# Patient Record
Sex: Female | Born: 1985 | Race: White | Hispanic: No | Marital: Single | Smoking: Current every day smoker
Health system: Southern US, Community
[De-identification: ages and names within clinical notes are randomized; demographics above are authoritative.]

## PROBLEM LIST (undated history)

## (undated) DIAGNOSIS — F419 Anxiety disorder, unspecified: Secondary | ICD-10-CM

## (undated) DIAGNOSIS — F32A Depression, unspecified: Secondary | ICD-10-CM

## (undated) HISTORY — DX: Anxiety disorder, unspecified: F41.9

## (undated) HISTORY — PX: COLPOSCOPY: SHX161

## (undated) HISTORY — DX: Depression, unspecified: F32.A

---

## 2020-05-07 ENCOUNTER — Emergency Department (HOSPITAL_BASED_OUTPATIENT_CLINIC_OR_DEPARTMENT_OTHER): Payer: Medicaid - Out of State

## 2020-05-07 ENCOUNTER — Emergency Department (HOSPITAL_BASED_OUTPATIENT_CLINIC_OR_DEPARTMENT_OTHER)
Admission: EM | Admit: 2020-05-07 | Discharge: 2020-05-07 | Disposition: A | Discharge status: Home / Self Care | Payer: Medicaid - Out of State | Attending: Emergency Medicine | Admitting: Emergency Medicine

## 2020-05-07 ENCOUNTER — Encounter (HOSPITAL_BASED_OUTPATIENT_CLINIC_OR_DEPARTMENT_OTHER): Payer: Self-pay

## 2020-05-07 ENCOUNTER — Other Ambulatory Visit: Payer: Self-pay

## 2020-05-07 DIAGNOSIS — O200 Threatened abortion: Secondary | ICD-10-CM | POA: Insufficient documentation

## 2020-05-07 DIAGNOSIS — O209 Hemorrhage in early pregnancy, unspecified: Secondary | ICD-10-CM | POA: Diagnosis present

## 2020-05-07 DIAGNOSIS — N939 Abnormal uterine and vaginal bleeding, unspecified: Secondary | ICD-10-CM

## 2020-05-07 DIAGNOSIS — O3680X0 Pregnancy with inconclusive fetal viability, not applicable or unspecified: Secondary | ICD-10-CM

## 2020-05-07 DIAGNOSIS — Z3A01 Less than 8 weeks gestation of pregnancy: Secondary | ICD-10-CM | POA: Insufficient documentation

## 2020-05-07 DIAGNOSIS — F17210 Nicotine dependence, cigarettes, uncomplicated: Secondary | ICD-10-CM | POA: Insufficient documentation

## 2020-05-07 LAB — CBC
HCT: 41 % (ref 36.0–46.0)
Hemoglobin: 14 g/dL (ref 12.0–15.0)
MCH: 33.7 pg (ref 26.0–34.0)
MCHC: 34.1 g/dL (ref 30.0–36.0)
MCV: 98.6 fL (ref 80.0–100.0)
Platelets: 271 10*3/uL (ref 150–400)
RBC: 4.16 MIL/uL (ref 3.87–5.11)
RDW: 12.6 % (ref 11.5–15.5)
WBC: 8.9 10*3/uL (ref 4.0–10.5)
nRBC: 0 % (ref 0.0–0.2)

## 2020-05-07 LAB — HCG, QUANTITATIVE, PREGNANCY: hCG, Beta Chain, Quant, S: 1044 m[IU]/mL — ABNORMAL HIGH (ref <5)

## 2020-05-07 LAB — HIV ANTIBODY (ROUTINE TESTING W REFLEX): HIV Screen 4th Generation wRfx: NONREACTIVE

## 2020-05-07 LAB — BASIC METABOLIC PANEL
Anion gap: 10 (ref 5–15)
BUN: 10 mg/dL (ref 6–20)
CO2: 23 mmol/L (ref 22–32)
Calcium: 9.5 mg/dL (ref 8.9–10.3)
Chloride: 102 mmol/L (ref 98–111)
Creatinine, Ser: 0.62 mg/dL (ref 0.44–1.00)
GFR, Estimated: >60 mL/min (ref >60)
Glucose, Bld: 90 mg/dL (ref 70–99)
Potassium: 4.4 mmol/L (ref 3.5–5.1)
Sodium: 135 mmol/L (ref 135–145)

## 2020-05-07 LAB — WET PREP, GENITAL
Sperm: NONE SEEN
Trich, Wet Prep: NONE SEEN
Yeast Wet Prep HPF POC: NONE SEEN

## 2020-05-07 LAB — PREGNANCY, URINE: Preg Test, Ur: POSITIVE — AB

## 2020-05-07 NOTE — ED Triage Notes (Signed)
Pt arrives stating she is appx [redacted] weeks pregnant. The past 3 days has had brown vaginal discharge, lower back pain, lower abdominal cramping. G5P2.

## 2020-05-07 NOTE — Discharge Instructions (Addendum)
You will need to follow up in 48 hours for a repeat Beta HCG test with the [facility]. Your appointment is at 9:25 AM.  Get help right away if: Heavy bleeding soaks through 2 large sanitary pads an hour for more than 2 hours. Blood clots come out of your vagina. Tissue comes out of your vagina. You leak fluid, or you have a gush of fluid from your vagina. You have severe low back pain or cramps in your abdomen. You have a fever, chills, and severe pain in the abdomen.

## 2020-05-07 NOTE — ED Provider Notes (Signed)
[facility] EMERGENCY DEPARTMENT Provider Note   CSN: [id number] Arrival date & time: 05/07/20  1142     History Chief Complaint  Patient presents with  . Vaginal Bleeding    Pregnant    Miranda Boyd is a 35 y.o. female who presents emergency department with chief complaint of vaginal bleeding.  Patient has had a positive pregnancy test at home with last menstrual period on 04/06/2020.  She complains of some mild low back pain, some vaginal cramping and some brown bloody discharge on and off for the past 3 days.  She is G5 D6222.  She denies any severe pain, fevers, chills, urinary symptoms.  HPI     History reviewed. No pertinent past medical history.  There are no problems to display for this patient.   History reviewed. No pertinent surgical history.   OB History    Gravida  1   Para      Term      Preterm      AB      Living        SAB      IAB      Ectopic      Multiple      Live Births              History reviewed. No pertinent family history.  Social History   Tobacco Use  . Smoking status: Current Every Day Smoker    Packs/day: 0.50    Types: Cigarettes  Substance Use Topics  . Alcohol use: Not Currently  . Drug use: Never    Home Medications Prior to Admission medications   Not on File    Allergies    Keflex [cephalexin]  Review of Systems   Review of Systems Ten systems reviewed and are negative for acute change, except as noted in the HPI.  Physical Exam Updated Vital Signs BP 112/66   Pulse 94   Temp 98.9 F (37.2 C) (Oral)   Resp 18   Ht 5\' 4"  (1.626 m)   Wt 59 kg   LMP 04/06/2020   SpO2 99%   BMI 22.31 kg/m   Physical Exam Vitals and nursing note reviewed. Exam conducted with a chaperone present.  Constitutional:      General: She is not in acute distress.    Appearance: She is well-developed and well-nourished. She is not diaphoretic.  HENT:     Head: Normocephalic and atraumatic.  Eyes:      General: No scleral icterus.    Conjunctiva/sclera: Conjunctivae normal.  Cardiovascular:     Rate and Rhythm: Normal rate and regular rhythm.     Heart sounds: Normal heart sounds. No murmur heard. No friction rub. No gallop.   Pulmonary:     Effort: Pulmonary effort is normal. No respiratory distress.     Breath sounds: Normal breath sounds.  Abdominal:     General: Bowel sounds are normal. There is no distension.     Palpations: Abdomen is soft. There is no mass.     Tenderness: There is no abdominal tenderness. There is no guarding.  Genitourinary:    Exam position: Supine.     Comments: Normal external female genitalia, vaginal walls pink and healthy without discharge.  There is blood in the vaginal vault stemming from oozing blood from the cervical os.  Positive Chadwick sign.  No adnexal tenderness or fullness, no CMT. Musculoskeletal:     Cervical back: Normal range of motion.  Skin:  General: Skin is warm and dry.  Neurological:     Mental Status: She is alert and oriented to person, place, and time.  Psychiatric:        Behavior: Behavior normal.     ED Results / Procedures / Treatments   Labs (all labs ordered are listed, but only abnormal results are displayed) Labs Reviewed  WET PREP, GENITAL - Abnormal; Notable for the following components:      Result Value   Clue Cells Wet Prep HPF POC PRESENT (*)    WBC, Wet Prep HPF POC MANY (*)    All other components within normal limits  PREGNANCY, URINE - Abnormal; Notable for the following components:   Preg Test, Ur POSITIVE (*)    All other components within normal limits  HCG, QUANTITATIVE, PREGNANCY - Abnormal; Notable for the following components:   hCG, Beta Chain, Quant, S 1,044 (*)    All other components within normal limits  BASIC METABOLIC PANEL  CBC  RPR  URINALYSIS, ROUTINE W REFLEX MICROSCOPIC  HIV ANTIBODY (ROUTINE TESTING W REFLEX)  GC/CHLAMYDIA PROBE AMP ([place]) NOT AT [facility]     EKG None  Radiology US OB LESS THAN 14 WEEKS WITH OB TRANSVAGINAL  Result Date: 05/07/2020 CLINICAL DATA:  Pregnant patient in first-trimester pregnancy with vaginal bleeding. Beta HCG 1,044. gestational age by LMP 4 weeks 3 days. EXAM: OBSTETRIC <14 WK [place] AND TRANSVAGINAL OB US TECHNIQUE: Both transabdominal and transvaginal ultrasound examinations were performed for complete evaluation of the gestation as well as the maternal uterus, adnexal regions, and pelvic cul-de-sac. Transvaginal technique was performed to assess early pregnancy. COMPARISON:  None. FINDINGS: Intrauterine gestational sac: None Yolk sac:  Not Visualized. Embryo:  Not Visualized. Maternal uterus/adnexae: The uterus is anteverted. The fundal endometrium is thickened at 15 mm and heterogeneous. There are few tiny cystic areas in the thickened endometrium. Endometrium in the uterine body and lower uterine segment is thin. There is no intrauterine gestational sac. Both ovaries are visualized and are normal. The right ovary measures 3.1 x 1.9 x 2.0 cm. The left ovary measures 2.7 x 1.9 x 1.9 cm. Blood flow seen to both ovaries. No pelvic free fluid. No evidence of adnexal mass. IMPRESSION: 1. No intrauterine pregnancy or findings suspicious for ectopic pregnancy. Findings are consistent with pregnancy of unknown location and may reflect early intrauterine pregnancy not yet visualized sonographically, occult ectopic pregnancy, or failed pregnancy. Recommend trending of beta HCG. Recommend follow-up ultrasound in 7-10 days as indicated. 2. Fundal endometrium is thickened, heterogeneous with a few small cystic areas, nonspecific. Recommend attention at follow-up. Electronically Signed   By: Narda Rutherford M.D.   On: 05/07/2020 15:42    Procedures Procedures   Medications Ordered in ED Medications - No data to display  ED Course  I have reviewed the triage vital signs and the nursing notes.  Pertinent labs & imaging results that  were available during my care of the patient were reviewed by me and considered in my medical decision making (see chart for details).  Clinical Course as of 05/07/20 1608  Wed May 07, 2020  1421 EDD 01/11/2021 EGA [redacted]w[redacted]d [AH]    Clinical Course User Index [AH] Arthor Captain, PA-C   MDM Rules/Calculators/A&P                          35 year old female here with vaginal bleeding in first trimester. The differential diagnosis for vaginal bleeding in pregnancy  less than 20 weeks includes but is not limited to the following: Ectopic pregnancy, Subchorionic hematoma, First Trimester Abortion, Gestational trophoblastic disease, Heterotopic pregnancy, Implantation bleeding, Molar pregnancy, Cervicitis, Fibroids, Vaginal Trauma I ordered and reviewed labs which include CBC and BMP without abnormality, quantitative hCG of 1044 which would be low but within range of estimated 4-week 2-day pregnancy. Blood prep shows mild clue cells and white cells however patient is otherwise asymptomatic.  I ordered and reviewed ultrasound of the pelvis which shows no visualized pregnancy.  Patient will need 2-day follow-up with GYN for repeat hCG level.  I have secured a follow-up appointment at 9:25 AM here at the [facility].  Discussed return precautions.  Patient is otherwise appropriate for discharge at this time Final Clinical Impression(s) / ED Diagnoses Final diagnoses:  Pregnancy of unknown anatomic location  Threatened miscarriage    Rx / DC Orders ED Discharge Orders    None       Arthor Captain, PA-C 05/07/20 1612    Melene Plan, DO 05/08/20 0800

## 2020-05-08 LAB — GC/CHLAMYDIA PROBE AMP (~~LOC~~) NOT AT ARMC
Chlamydia: NEGATIVE
Comment: NEGATIVE
Comment: NORMAL
Neisseria Gonorrhea: NEGATIVE

## 2020-05-08 LAB — RPR: RPR Ser Ql: NONREACTIVE

## 2020-05-09 ENCOUNTER — Other Ambulatory Visit: Payer: 59

## 2020-05-09 ENCOUNTER — Other Ambulatory Visit: Payer: Self-pay

## 2020-05-09 DIAGNOSIS — O3680X0 Pregnancy with inconclusive fetal viability, not applicable or unspecified: Secondary | ICD-10-CM

## 2020-05-09 NOTE — Progress Notes (Signed)
Patient was assessed and managed by nursing staff during this encounter. I have reviewed the chart and agree with the documentation and plan. I have also made any necessary editorial changes.  Jaynie Collins, MD 05/09/2020 11:17 AM

## 2020-05-09 NOTE — Progress Notes (Signed)
Pt presents for repeat Beta hCG. Pt states she is having severe cramps and heavy bleeding. Pt was sent to the lab to have Beta hCG labs drawn.   Arthur Aydelotte l Cortasia Screws, CMA

## 2020-05-10 LAB — BETA HCG QUANT (REF LAB): hCG Quant: 1279 m[IU]/mL

## 2020-05-12 ENCOUNTER — Inpatient Hospital Stay (HOSPITAL_COMMUNITY)
Admission: AD | Admit: 2020-05-12 | Discharge: 2020-05-12 | Disposition: A | Discharge status: Home / Self Care | Payer: Medicaid - Out of State | Attending: Family Medicine | Admitting: Family Medicine

## 2020-05-12 ENCOUNTER — Encounter (HOSPITAL_COMMUNITY): Payer: Self-pay | Admitting: Family Medicine

## 2020-05-12 ENCOUNTER — Telehealth: Payer: Self-pay

## 2020-05-12 ENCOUNTER — Other Ambulatory Visit: Payer: Self-pay

## 2020-05-12 ENCOUNTER — Inpatient Hospital Stay (HOSPITAL_COMMUNITY): Payer: Medicaid - Out of State

## 2020-05-12 DIAGNOSIS — O00101 Right tubal pregnancy without intrauterine pregnancy: Secondary | ICD-10-CM | POA: Diagnosis present

## 2020-05-12 DIAGNOSIS — Z3A01 Less than 8 weeks gestation of pregnancy: Secondary | ICD-10-CM | POA: Insufficient documentation

## 2020-05-12 DIAGNOSIS — O99331 Smoking (tobacco) complicating pregnancy, first trimester: Secondary | ICD-10-CM | POA: Diagnosis not present

## 2020-05-12 DIAGNOSIS — O00201 Right ovarian pregnancy without intrauterine pregnancy: Secondary | ICD-10-CM

## 2020-05-12 DIAGNOSIS — F17210 Nicotine dependence, cigarettes, uncomplicated: Secondary | ICD-10-CM | POA: Insufficient documentation

## 2020-05-12 LAB — COMPREHENSIVE METABOLIC PANEL
ALT: 22 U/L (ref 0–44)
AST: 20 U/L (ref 15–41)
Albumin: 4.3 g/dL (ref 3.5–5.0)
Alkaline Phosphatase: 43 U/L (ref 38–126)
Anion gap: 10 (ref 5–15)
BUN: 11 mg/dL (ref 6–20)
CO2: 25 mmol/L (ref 22–32)
Calcium: 9.5 mg/dL (ref 8.9–10.3)
Chloride: 104 mmol/L (ref 98–111)
Creatinine, Ser: 0.68 mg/dL (ref 0.44–1.00)
GFR, Estimated: >60 mL/min (ref >60)
Glucose, Bld: 84 mg/dL (ref 70–99)
Potassium: 4.2 mmol/L (ref 3.5–5.1)
Sodium: 139 mmol/L (ref 135–145)
Total Bilirubin: 0.5 mg/dL (ref 0.3–1.2)
Total Protein: 7 g/dL (ref 6.5–8.1)

## 2020-05-12 LAB — CBC
HCT: 39.2 % (ref 36.0–46.0)
Hemoglobin: 13.1 g/dL (ref 12.0–15.0)
MCH: 32.7 pg (ref 26.0–34.0)
MCHC: 33.4 g/dL (ref 30.0–36.0)
MCV: 97.8 fL (ref 80.0–100.0)
Platelets: 277 10*3/uL (ref 150–400)
RBC: 4.01 MIL/uL (ref 3.87–5.11)
RDW: 12.5 % (ref 11.5–15.5)
WBC: 8.4 10*3/uL (ref 4.0–10.5)
nRBC: 0 % (ref 0.0–0.2)

## 2020-05-12 LAB — URINALYSIS, ROUTINE W REFLEX MICROSCOPIC
Bilirubin Urine: NEGATIVE
Glucose, UA: NEGATIVE mg/dL
Hgb urine dipstick: NEGATIVE
Ketones, ur: NEGATIVE mg/dL
Leukocytes,Ua: NEGATIVE
Nitrite: NEGATIVE
Protein, ur: NEGATIVE mg/dL
Specific Gravity, Urine: 1.021 (ref 1.005–1.030)
pH: 7 (ref 5.0–8.0)

## 2020-05-12 LAB — HCG, QUANTITATIVE, PREGNANCY: hCG, Beta Chain, Quant, S: 3812 m[IU]/mL — ABNORMAL HIGH (ref <5)

## 2020-05-12 LAB — ABO/RH: ABO/RH(D): A POS

## 2020-05-12 MED ORDER — METHOTREXATE SODIUM CHEMO INJECTION 50 MG/2ML
50.0000 mg/m2 | Freq: Once | INTRAMUSCULAR | Status: AC
  Administered 2020-05-12: 85 mg via INTRAMUSCULAR
  Filled 2020-05-12: qty 3.4

## 2020-05-12 MED ORDER — METHOTREXATE FOR ECTOPIC PREGNANCY: 50.0000 mg/m2 | Freq: Once | INTRAMUSCULAR | Status: DC

## 2020-05-12 MED ORDER — TRAMADOL HCL 50 MG PO TABS: 50.0000 mg | ORAL_TABLET | Freq: Four times a day (QID) | ORAL | 0 refills | Status: DC | PRN

## 2020-05-12 NOTE — MAU Provider Note (Signed)
  History     CSN: [id number]  Arrival date and time: 05/12/20 1407   None     Chief Complaint  Patient presents with  . Back Pain  . Vaginal Bleeding   HPI Patient Miranda Boyd is a 35 y.o.  G1P0  at [redacted]w[redacted]d here for follow up bHCG. Patient has been followed for pregnancy of unknown location since 2-4; she denies pain with urination but reports a right constant pain and spotting. These symptoms started yesterday.   OB History    Gravida  1   Para      Term      Preterm      AB      Living        SAB      IAB      Ectopic      Multiple      Live Births              No past medical history on file.  No past surgical history on file.  No family history on file.  Social History   Tobacco Use  . Smoking status: Current Every Day Smoker    Packs/day: 0.50    Types: Cigarettes  Substance Use Topics  . Alcohol use: Not Currently  . Drug use: Never    Allergies:  Allergies  Allergen Reactions  . Keflex [Cephalexin] Hives    No medications prior to admission.    Review of Systems  Constitutional: Negative.   HENT: Negative.   Respiratory: Negative.   Cardiovascular: Negative.   Gastrointestinal: Positive for abdominal pain.  Genitourinary: Positive for vaginal bleeding.  Psychiatric/Behavioral: Negative.    Physical Exam   Blood pressure 113/63, pulse 79, temperature 98.9 F (37.2 C), temperature source Oral, resp. rate 16, height 5\' 4"  (1.626 m), weight 62.8 kg, last menstrual period 04/06/2020, SpO2 99 %.  Physical Exam Constitutional:      Appearance: Normal appearance.  Neurological:     General: No focal deficit present.     Mental Status: She is alert.  Psychiatric:        Mood and Affect: Mood normal.    Pelvic exam deferred MAU Course  Procedures  MDM Patient's bHCG increases from 1044 to 1270 to 3812; however, patient has new onset of pain and spotting; discussed with Dr. 06/04/2020 and will re-image.   -LFTS reviewed; all  normal  I have independently reviewed the [facility] images, which reveal finding of right tubal ovarian mass.   Assessment and Plan   1. Right tubal pregnancy without intrauterine pregnancy    -LFTs are normal; patient is appropriate candidate for Methotrexate; patient given precautions and guidance on what to expect. Medication administered without incident in MAU.  -Appt made for Thursday for follow-up stat [facility] and patient given instructions to wait for phone call, and she knows she may need another dose of MTX or will follow-up in MAU for Day # 7 labs.  -Patient given strict ectopic precautions, explained importance of trending bhcg.    [facility] Fumiko Cham 05/12/2020, 8:28 PM

## 2020-05-12 NOTE — Telephone Encounter (Signed)
Patient had her HCG drawn on Friday and given the results. Patient made aware that the HCG level was 1279 (on 05-07-20 it was 1044) Patient called the after hours line this weekend and was having some abdominal cramping and she reports now she is having some light bleeding. Patient was instructed to go to hospital for evaluation but has not gone anywhere.   Patient instructed this would still be the best course of action since she is having the abdominal pain and the inappropriate rise in her HCG. Patient states she will probably go "sometime today".  Patient given address to MAU at [facility] RN

## 2020-05-12 NOTE — Discharge Instructions (Signed)
-keep appt on Thursday, 2-10 at 1:30 pm for day# 4 labs(see address on discharge papers); return to MAU on Sunday morning for Day #7 labs.    Methotrexate Treatment for an Ectopic Pregnancy Methotrexate is a medicine that treats an ectopic pregnancy. In this type of pregnancy, the fertilized egg attaches (implants) outside the uterus. An ectopic pregnancy cannot develop into a healthy baby. Methotrexate works by stopping the growth of the fertilized egg. It also helps the body absorb tissue from the egg. This takes about 2-6 weeks. An ectopic pregnancy can be life-threatening. However, most ectopic pregnancies can be successfully treated with methotrexate if they are diagnosed early. Tell a health care provider about:  Any allergies you have.  All medicines you are taking, including vitamins, herbs, eye drops, creams, and over-the-counter medicines.  Any medical conditions you have. What are the risks? Generally, this is a safe treatment. However, problems may occur, including:  Digestive problems. You may have: ? Nausea. ? Vomiting. ? Diarrhea. ? Cramping in your abdomen.  Bleeding or spotting from your vagina.  Feeling dizzy or light-headed.  Mouth sores.  Inflammation of the lining of your lungs (pneumonitis).  Damage to nearby structures or organs, such as damage to the liver.  Hair loss. There is a risk that methotrexate treatment will fail and the pregnancy will continue. There is also a risk that the ectopic pregnancy might tear or burst (rupture) during use of this medicine. What happens before the procedure?  Blood tests will be done to check how your disease-fighting system (immune system), liver, and kidneys are working.  You will also have blood tests to measure your pregnancy hormone levels and to find out your blood type.  You will be given a shot of a medicine called Rho(D) immune globulin if: ? You are Rh-negative and the father is Rh-positive. ? You are  Rh-negative and the father's Rh type is unknown. What happens during the procedure?  Methotrexate will be injected into your muscle. ? Methotrexate may be given as a single dose of medicine or a series of doses over time, depending on your response to the treatment. ? Methotrexate injections are given by a health care provider. Injection is the most common way that this medicine is used to treat an ectopic pregnancy.  You may also receive other medicines to manage your ectopic pregnancy. The procedure may vary among health care providers and hospitals. What can I expect after treatment? After your treatment, it is common to have:  Cramping in your abdomen.  Bleeding in your vagina.  Tiredness (fatigue).  Nausea.  Vomiting.  Diarrhea. Blood tests will be done at timed intervals for several days or weeks to check your pregnancy hormone levels. The blood tests will be done until the pregnancy hormone can no longer be found in the blood. If the methotrexate treatment does not work, a surgical procedure may be done to remove the ectopic pregnancy. Follow these instructions at home: Medicines  Take over-the-counter and prescription medicines only as told by your health care provider.  Do not take prescription pain medicines, aspirin, ibuprofen, naproxen, or any other NSAIDs.  Do not take folic acid, prenatal vitamins, or other vitamins that contain folic acid. Activity  Do not have sex, douche, or put anything, such as tampons, in your vagina until your health care provider says it is okay.  Limit activities that take a lot of effort as told by your health care provider. General instructions  Do not drink alcohol.  Follow instructions from your health care provider about eating restrictions, such as avoiding foods that produce a lot of gas. These foods can hide the signs of a ruptured ectopic pregnancy.  Limit exposure to sunlight or artificial UV light such as from tanning  beds. Methotrexate can make you more sensitive to the sun.  Follow instructions from your health care provider on how and when to report any symptoms that may indicate a ruptured ectopic pregnancy.  Keep all follow-up visits. This is important.   Contact a health care provider if:  You have persistent nausea and vomiting.  You have persistent diarrhea.  You are having a reaction to the medicine. This may include: ? Unusual fatigue. ? Skin rash. Get help right away if:  Pain in your abdomen or in the area between your hip bones (pelvic area) gets worse.  You have more bleeding from your vagina.  You feel light-headed or you faint.  You are short of breath.  Your heart rate increases.  You develop a cough.  You have chills or a fever. Summary  Methotrexate is a medicine that treats an ectopic pregnancy. This type of pregnancy forms outside the uterus.  There is a risk that methotrexate treatment will fail and the pregnancy will continue. There is also a risk that the ectopic pregnancy might tear or burst during use of this medicine.  This medicine may be given in a single dose or a series of doses over time.  After your treatment, blood tests will be done at timed intervals for several days or weeks to check your pregnancy hormone levels. The blood tests will be done until no more pregnancy hormone is found in the blood. This information is not intended to replace advice given to you by your health care provider. Make sure you discuss any questions you have with your health care provider. Document Revised: 09/05/2019 Document Reviewed: 09/05/2019 Elsevier Patient Education  2021 [employer].

## 2020-05-12 NOTE — MAU Note (Signed)
Miranda Boyd is a 35 y.o. at [redacted]w[redacted]d here in MAU reporting: was sent over by the office for evaluation due to abnormal rise in hcg level. Is having some back pain and saw some red bleeding this AM but it is now brown.   Onset of complaint: ongoing  Pain score: 7/10  Vitals:   05/12/20 1420  BP: 113/63  Pulse: 79  Resp: 16  Temp: 98.9 F (37.2 C)  SpO2: 99%     Lab orders placed from triage: UA

## 2020-05-15 ENCOUNTER — Ambulatory Visit: Payer: 59

## 2020-05-15 ENCOUNTER — Other Ambulatory Visit: Payer: Self-pay

## 2020-05-15 DIAGNOSIS — O00201 Right ovarian pregnancy without intrauterine pregnancy: Secondary | ICD-10-CM

## 2020-05-15 LAB — BETA HCG QUANT (REF LAB): hCG Quant: 4306 m[IU]/mL

## 2020-05-15 NOTE — Progress Notes (Signed)
Pt here today for STAT Beta s/p day 4 MTX for ectopic pregnancy.  Pt reports that is not having any vaginal bleeding or pain.  I advised pt that someone will call her around 5 pm with results.  Pt verbalized understanding.    Received notification from [facility] that pt's beta results are 4306.  Reviewed results with Dr. Alysia Penna who recommended that pt f/u with day 7 STAT beta in MAU due to it being the weekend and we determine f/u from then.  Left message stating that I am calling with results please give the office a call back.  We will attempted to call back by end of day.

## 2020-05-15 NOTE — Progress Notes (Signed)
Called pt with provider recommendation regarding f/u to mtx; VM left requesting a callback from patient after 8AM tomorrow.   Fleet Contras RN 05/15/20

## 2020-05-16 ENCOUNTER — Telehealth: Payer: Self-pay | Admitting: Family Medicine

## 2020-05-16 NOTE — Telephone Encounter (Signed)
Pt calling back to get test results

## 2020-05-19 ENCOUNTER — Telehealth: Payer: Self-pay | Admitting: Obstetrics and Gynecology

## 2020-05-19 NOTE — Telephone Encounter (Addendum)
Call returned to pt and discussed test results (BHCG) from 2/10. Pt had needed to have BHCG repeated yesterday (2/13) @ MAU for day #7 MTX, however we had not been able to reach her to deliver the message. After discussion with Dr. Alysia Penna, pt was informed to come to office tomorrow @ 1:15 for stat BHCG. She agreed and voiced understanding.

## 2020-05-20 ENCOUNTER — Encounter: Payer: Self-pay | Admitting: *Deleted

## 2020-05-20 ENCOUNTER — Ambulatory Visit (INDEPENDENT_AMBULATORY_CARE_PROVIDER_SITE_OTHER): Payer: 59 | Admitting: *Deleted

## 2020-05-20 ENCOUNTER — Other Ambulatory Visit: Payer: Self-pay

## 2020-05-20 ENCOUNTER — Inpatient Hospital Stay (HOSPITAL_COMMUNITY)
Admission: AD | Admit: 2020-05-20 | Discharge: 2020-05-20 | Disposition: A | Discharge status: Home / Self Care | Payer: Medicaid - Out of State | Attending: Obstetrics & Gynecology | Admitting: Obstetrics & Gynecology

## 2020-05-20 VITALS — BP 132/66 | HR 78 | Ht 64.0 in

## 2020-05-20 DIAGNOSIS — O00201 Right ovarian pregnancy without intrauterine pregnancy: Secondary | ICD-10-CM | POA: Insufficient documentation

## 2020-05-20 DIAGNOSIS — O009 Unspecified ectopic pregnancy without intrauterine pregnancy: Secondary | ICD-10-CM | POA: Diagnosis not present

## 2020-05-20 DIAGNOSIS — Z6790 Unspecified blood type, Rh positive: Secondary | ICD-10-CM | POA: Diagnosis not present

## 2020-05-20 DIAGNOSIS — Z3A01 Less than 8 weeks gestation of pregnancy: Secondary | ICD-10-CM | POA: Diagnosis not present

## 2020-05-20 LAB — COMPREHENSIVE METABOLIC PANEL
ALT: 26 U/L (ref 0–44)
AST: 22 U/L (ref 15–41)
Albumin: 4.3 g/dL (ref 3.5–5.0)
Alkaline Phosphatase: 42 U/L (ref 38–126)
Anion gap: 9 (ref 5–15)
BUN: 12 mg/dL (ref 6–20)
CO2: 26 mmol/L (ref 22–32)
Calcium: 9.7 mg/dL (ref 8.9–10.3)
Chloride: 105 mmol/L (ref 98–111)
Creatinine, Ser: 0.75 mg/dL (ref 0.44–1.00)
GFR, Estimated: >60 mL/min (ref >60)
Glucose, Bld: 108 mg/dL — ABNORMAL HIGH (ref 70–99)
Potassium: 4.1 mmol/L (ref 3.5–5.1)
Sodium: 140 mmol/L (ref 135–145)
Total Bilirubin: 0.8 mg/dL (ref 0.3–1.2)
Total Protein: 7 g/dL (ref 6.5–8.1)

## 2020-05-20 LAB — CBC
HCT: 38.6 % (ref 36.0–46.0)
Hemoglobin: 13.5 g/dL (ref 12.0–15.0)
MCH: 33.5 pg (ref 26.0–34.0)
MCHC: 35 g/dL (ref 30.0–36.0)
MCV: 95.8 fL (ref 80.0–100.0)
Platelets: 265 10*3/uL (ref 150–400)
RBC: 4.03 MIL/uL (ref 3.87–5.11)
RDW: 12.5 % (ref 11.5–15.5)
WBC: 10 10*3/uL (ref 4.0–10.5)
nRBC: 0 % (ref 0.0–0.2)

## 2020-05-20 LAB — BETA HCG QUANT (REF LAB): hCG Quant: 5526 m[IU]/mL

## 2020-05-20 MED ORDER — METHOTREXATE SODIUM CHEMO INJECTION 50 MG/2ML
50.0000 mg/m2 | Freq: Once | INTRAMUSCULAR | Status: AC
  Administered 2020-05-20: 85 mg via INTRAMUSCULAR
  Filled 2020-05-20: qty 3.4

## 2020-05-20 MED ORDER — METHOTREXATE FOR ECTOPIC PREGNANCY: 50.0000 mg/m2 | Freq: Once | INTRAMUSCULAR | Status: DC

## 2020-05-20 NOTE — MAU Provider Note (Addendum)
None     [name] is a 35 y.o. G1P0 patient who presents to MAU today for her second dose of Methotrexate (MTX) due to increasing quant hCG. Her first dose of MTX was 05/12/2020. Today she denies bleeding and pain.   O BP (!) 117/59   Pulse 84   Temp 98.3 F (36.8 C)   Resp 15   LMP 04/06/2020 (Approximate)     Patient Vitals for the past 24 hrs:  BP Temp Pulse Resp  05/20/20 1844 (!) 117/59 98.3 F (36.8 C) 84 15    Physical Exam Vitals and nursing note reviewed. Exam conducted with a chaperone present.  Constitutional:      Appearance: Normal appearance.  Cardiovascular:     Rate and Rhythm: Normal rate.  Pulmonary:     Effort: Pulmonary effort is normal.  Skin:    Capillary Refill: Capillary refill takes less than 2 seconds.  Neurological:     Mental Status: She is alert and oriented to person, place, and time.  Psychiatric:        Mood and Affect: Mood normal.        Behavior: Behavior normal.        Thought Content: Thought content normal.        Judgment: Judgment normal.     A Medical screening exam complete Confirmed with Dr. Macon Large patient is appropriate for second administration of MTX  Methotrexate Treatment Protocol for Ectopic Pregnancy  Pretreatment testing and instructions  hCG concentration  Transvaginal ultrasound  Blood group and Rh(D) typing; give Rhogam 300 mcg IM, if indicated  Complete blood count  Liver and renal function tests  Discontinue folic acid supplements  Counsel patient to avoid NSAIDs, recommend acetaminophen if an analgesic is needed  Advise patient to refrain from sexual intercourse and strenuous exercise  Treatment day  Single dose protocol   1 Tues 05/20/2020 hCG.  Administer Methotrexate 50 mg/m2 body surface area IM  4  hCG  7  hCG  If <15 percent hCG decline from day 4 to 7, give additional dose of methotrexate 50 mg/m2 IM  If ?15 percent hCG decline from day 4 to 7, draw hCG weekly until undetectable     Report given to N. Nugent, WHNP who assumes care of patient at this time  Clayton Bibles, MSN, CNM Certified Nurse Midwife, [facility] for [employer], [facility] 05/20/20 8:11 PM   HCG today 5526, was 4306 5 days ago and was 3,812 on day of MTX injection #1 CBC: WNL CMP: WNL   P Day #4 hCG Friday 05/23/2020, 830AM Day #7 hCG Monday 05/26/2020, 830AM Appointments scheduled for patient while in MAU Ectopic/return MAU precautions given Patient discharged to home in stable condition  Nugent, Odie Sera, NP  8:36 PM 05/20/2020

## 2020-05-20 NOTE — Progress Notes (Signed)
Pt presents for stat BHCG following MTX on 05/13/19. Pt had missed Natasa Stigall#7 BHCG on 2/13. She reports no bleeding or abdominal cramping. Pt advised that she will be called later today with results. She stated that a detailed message can be left on her voicemail of she does not answer.   1700  BHCG results (5526) reviewed with Steward Drone. Recommendation is for pt to have 2nd dose of MTX @ MAU today. I called pt and informed her of results as well as recommended plan of care. She voiced understanding and agreed to go to MAU today.

## 2020-05-20 NOTE — MAU Note (Signed)
Here for second Methotrexate injection.  Denies any other issues she needs to be evaluated for.

## 2020-05-20 NOTE — Progress Notes (Signed)
Patient was assessed and managed by nursing staff during this encounter. I have reviewed the chart and agree with the documentation and plan.   Notified Thalia Bloodgood CNM of patient coming to MAU   Sharyon Cable, [place] 05/20/2020 5:24 PM

## 2020-05-20 NOTE — Discharge Instructions (Signed)
Methotrexate Treatment for an Ectopic Pregnancy Methotrexate is a medicine that treats an ectopic pregnancy. In this type of pregnancy, the fertilized egg attaches (implants) outside the uterus. An ectopic pregnancy cannot develop into a healthy baby. Methotrexate works by stopping the growth of the fertilized egg. It also helps the body absorb tissue from the egg. This takes about 2-6 weeks. An ectopic pregnancy can be life-threatening. However, most ectopic pregnancies can be successfully treated with methotrexate if they are diagnosed early. Tell a health care provider about:  Any allergies you have.  All medicines you are taking, including vitamins, herbs, eye drops, creams, and over-the-counter medicines.  Any medical conditions you have. What are the risks? Generally, this is a safe treatment. However, problems may occur, including:  Digestive problems. You may have: ? Nausea. ? Vomiting. ? Diarrhea. ? Cramping in your abdomen.  Bleeding or spotting from your vagina.  Feeling dizzy or light-headed.  Mouth sores.  Inflammation of the lining of your lungs (pneumonitis).  Damage to nearby structures or organs, such as damage to the liver.  Hair loss. There is a risk that methotrexate treatment will fail and the pregnancy will continue. There is also a risk that the ectopic pregnancy might tear or burst (rupture) during use of this medicine. What happens before the procedure?  Blood tests will be done to check how your disease-fighting system (immune system), liver, and kidneys are working.  You will also have blood tests to measure your pregnancy hormone levels and to find out your blood type.  You will be given a shot of a medicine called Rho(D) immune globulin if: ? You are Rh-negative and the father is Rh-positive. ? You are Rh-negative and the father's Rh type is unknown. What happens during the procedure?  Methotrexate will be injected into your  muscle. ? Methotrexate may be given as a single dose of medicine or a series of doses over time, depending on your response to the treatment. ? Methotrexate injections are given by a health care provider. Injection is the most common way that this medicine is used to treat an ectopic pregnancy.  You may also receive other medicines to manage your ectopic pregnancy. The procedure may vary among health care providers and hospitals. What can I expect after treatment? After your treatment, it is common to have:  Cramping in your abdomen.  Bleeding in your vagina.  Tiredness (fatigue).  Nausea.  Vomiting.  Diarrhea. Blood tests will be done at timed intervals for several days or weeks to check your pregnancy hormone levels. The blood tests will be done until the pregnancy hormone can no longer be found in the blood. If the methotrexate treatment does not work, a surgical procedure may be done to remove the ectopic pregnancy. Follow these instructions at home: Medicines  Take over-the-counter and prescription medicines only as told by your health care provider.  Do not take prescription pain medicines, aspirin, ibuprofen, naproxen, or any other NSAIDs.  Do not take folic acid, prenatal vitamins, or other vitamins that contain folic acid. Activity  Do not have sex, douche, or put anything, such as tampons, in your vagina until your health care provider says it is okay.  Limit activities that take a lot of effort as told by your health care provider. General instructions  Do not drink alcohol.  Follow instructions from your health care provider about eating restrictions, such as avoiding foods that produce a lot of gas. These foods can hide the signs of a   ruptured ectopic pregnancy.  Limit exposure to sunlight or artificial UV light such as from tanning beds. Methotrexate can make you more sensitive to the sun.  Follow instructions from your health care provider on how and when to  report any symptoms that may indicate a ruptured ectopic pregnancy.  Keep all follow-up visits. This is important.   Contact a health care provider if:  You have persistent nausea and vomiting.  You have persistent diarrhea.  You are having a reaction to the medicine. This may include: ? Unusual fatigue. ? Skin rash. Get help right away if:  Pain in your abdomen or in the area between your hip bones (pelvic area) gets worse.  You have more bleeding from your vagina.  You feel light-headed or you faint.  You are short of breath.  Your heart rate increases.  You develop a cough.  You have chills or a fever. Summary  Methotrexate is a medicine that treats an ectopic pregnancy. This type of pregnancy forms outside the uterus.  There is a risk that methotrexate treatment will fail and the pregnancy will continue. There is also a risk that the ectopic pregnancy might tear or burst during use of this medicine.  This medicine may be given in a single dose or a series of doses over time.  After your treatment, blood tests will be done at timed intervals for several days or weeks to check your pregnancy hormone levels. The blood tests will be done until no more pregnancy hormone is found in the blood. This information is not intended to replace advice given to you by your health care provider. Make sure you discuss any questions you have with your health care provider. Document Revised: 09/05/2019 Document Reviewed: 09/05/2019 Elsevier Patient Education  2021 Elsevier Inc.         Ectopic Pregnancy  An ectopic pregnancy happens when a fertilized egg attaches (implants) outside the uterus. In a normal pregnancy, a fertilized egg implants in the uterus. An ectopic pregnancy cannot develop into a healthy baby. Most ectopic pregnancies occur in one of the fallopian tubes, which is where an egg travels from an ovary to get to the uterus. This is called a tubal pregnancy. An ectopic  pregnancy can also happen on an ovary, on the cervix, or in the abdomen. When a fertilized egg implants on tissue outside the uterus and begins to grow, it may cause the tissue to tear or burst. This is known as a ruptured ectopic pregnancy. The tear or burst causes internal bleeding. This may cause intense pain in the abdomen. An ectopic pregnancy is a medical emergency and can be life-threatening. What are the causes? The most common cause of this condition is damage to one of the fallopian tubes. A fallopian tube may be narrowed or blocked, and that stops the fertilized egg from reaching the uterus. Sometimes, the cause of this condition is not known. What increases the risk? The following factors may make you more likely to develop this condition:  Having gone through infertility treatment before.  Having had an ectopic pregnancy before.  Having had surgery to have the fallopian tubes tied.  Becoming pregnant while using an intrauterine device for birth control.  Taking birth control pills before the age of 56. Other risk factors include:  Smoking.  Alcohol use.  History of DES exposure. DES is a medicine that was used until 1971 and affected babies whose mothers took the medicine. What are the signs or symptoms? Common symptoms  of this condition include:  Missing a menstrual period.  Nausea or tiredness.  Tender breasts.  Other normal pregnancy symptoms. Other symptoms may include:  Pain during sex.  Vaginal bleeding or spotting.  Cramping or pain in the lower abdomen.  A fast heartbeat, low blood pressure, and sweating.  Pain or increased pressure while having a bowel movement. Symptoms of a ruptured ectopic pregnancy and internal bleeding may include:  Sudden, severe pain in the abdomen.  Dizziness, weakness, feeling light-headed, or fainting.  Pain in the shoulder or neck area. How is this diagnosed? This condition is diagnosed by:  A blood test to check  for the pregnancy hormone.  A pelvic exam to find painful areas or a mass in the abdomen.  Ultrasound. A probe is inserted into the vagina to see if there is a pregnancy in or outside the uterus.  Taking a sample of tissue from the uterus.  Surgery to look closely at the fallopian tubes through an incision in the abdomen. How is this treated? This condition is usually treated with medicine or surgery. Sometimes, ectopic pregnancies can resolve on their own, under close monitoring by your health care provider. Medicine A medicine called methotrexate may be given to cause the pregnancy tissue to be absorbed. The medicine may be given if:  The diagnosis is made early, with no signs of active bleeding.  The fallopian tube has not torn or burst. You will need blood tests to make sure the medicine is working. It may take 4-6 weeks for the pregnancy tissues to be absorbed. Surgery Surgery may be performed to:  Remove the pregnancy tissue.  Stop internal bleeding.  Remove part or all of the fallopian tube.  Remove the uterus. This is rare. After surgery, you may need to have blood tests to make sure the surgery worked. Follow these instructions at home: Medicines  Take over-the-counter and prescription medicines only as told by your health care provider.  Ask your health care provider if the medicine prescribed to you: ? Requires you to avoid driving or using machinery. ? Can cause constipation. You may need to take these actions to prevent or treat constipation:  Drink enough fluid to keep your urine pale yellow.  Take over-the-counter or prescription medicines.  Eat foods that are high in fiber, such as beans, whole grains, and fresh fruits and vegetables.  Limit foods that are high in fat and processed sugars, such as fried or sweet foods. General instructions  Rest or limit your activity, if told by your health care provider.  Do not have sex or put anything in your  vagina, such as tampons or douches, for 6 weeks or until your health care provider says it is safe.  Do not lift anything that is heavier than 10 lb (4.5 kg), or the limit that you are told, until your health care provider says that it is safe.  Return to your normal activities as told by your health care provider. Ask your health care provider what activities are safe for you.  Keep all follow-up visits. This is important. Contact a health care provider if:  You have a fever or chills.  You have nausea and vomiting. Get help right away if:  Your pain gets worse or is not relieved by medicine.  You feel dizzy or weak.  You feel light-headed or you faint.  You have sudden, severe pain in your abdomen.  You have sudden pain in the shoulder or neck area. Summary  An ectopic pregnancy happens when a fertilized egg implants outside the uterus. Most ectopic pregnancies occur in one of the fallopian tubes.  An ectopic pregnancy is a medical emergency and can be life-threatening.  The most common cause of this condition is damage to one of the fallopian tubes.  This condition is usually treated with medicine or surgery. Some ectopic pregnancies resolve on their own, under close monitoring by your health care provider. This information is not intended to replace advice given to you by your health care provider. Make sure you discuss any questions you have with your health care provider. Document Revised: 07/03/2019 Document Reviewed: 07/03/2019 Elsevier Patient Education  2021 Elsevier Inc.         Ruptured Ectopic Pregnancy  An ectopic pregnancy happens when a fertilized egg attaches (implants) outside the uterus, usually in one of the fallopian tubes. This is where an egg travels from an ovary to get to the uterus. An ectopic pregnancy cannot develop into a healthy baby. When a fertilized egg implants on tissue outside the uterus and begins to grow, it may cause the tissue to  tear or burst. This is known as a ruptured ectopic pregnancy. The tear or burst causes internal bleeding. This may cause intense pain in the abdomen. A ruptured ectopic pregnancy can affect the ability to have children (fertility), depending on damage it causes to the reproductive organs. A ruptured ectopic pregnancy is a medical emergency. If not treated right away, it can lead to blood loss or shock, and it can be life-threatening. What are the causes? An ectopic pregnancy ruptures because it is growing in a spot that is not meant to expand and support the growth of a pregnancy. What increases the risk? You are more likely to have a ruptured ectopic pregnancy if:  You have an ectopic pregnancy, but you do not have any symptoms and the pregnancy is not found early enough to treat it before it ruptures.  You have nonsurgical treatment of an ectopic pregnancy.  You choose not to have any treatment for an ectopic pregnancy. What are the signs or symptoms? Symptoms of a ruptured ectopic pregnancy and internal bleeding may include:  Sudden, severe pain in the abdomen.  Feeling dizzy, weak, or light-headed.  Fainting.  Pain in the shoulder or neck area. How is this diagnosed? This condition is diagnosed based on your medical history, symptoms, a physical exam, and testing. Testing may include an ultrasound and blood tests. How is this treated? This condition is treated with IV fluids and emergency surgery to remove the ectopic pregnancy and repair the area where the rupture occurred. If a lot of blood was lost, donated blood may be needed (blood transfusion). You may receive a Rho (D) immune globulin shot if you are Rh negative and your baby's father is Rh positive, or if the Rh type of the father is unknown. This shot is given to prevent Rh problems in future pregnancies. You may receive other medicines. Summary  An ectopic pregnancy happens when a fertilized egg attaches (implants)  outside the uterus, usually in one of the fallopian tubes. When a fertilized egg implants on tissue outside the uterus and begins to grow, it may cause the tissue to tear or burst. This is known as a ruptured ectopic pregnancy.  A ruptured ectopic pregnancy is a medical emergency. If not treated right away, it can lead to blood loss or shock, and it can be life-threatening.  This condition is treated with IV  fluids and emergency surgery to remove the ectopic pregnancy and repair the area where the rupture occurred. If a lot of blood was lost, donated blood may be needed. This information is not intended to replace advice given to you by your health care provider. Make sure you discuss any questions you have with your health care provider. Document Revised: 07/03/2019 Document Reviewed: 07/03/2019 Elsevier Patient Education  2021 Elsevier Inc.        

## 2020-05-23 ENCOUNTER — Other Ambulatory Visit: Payer: 59

## 2020-05-23 ENCOUNTER — Inpatient Hospital Stay (HOSPITAL_COMMUNITY): Payer: Medicaid - Out of State

## 2020-05-23 ENCOUNTER — Encounter (HOSPITAL_COMMUNITY): Payer: Self-pay | Admitting: Obstetrics & Gynecology

## 2020-05-23 ENCOUNTER — Ambulatory Visit (INDEPENDENT_AMBULATORY_CARE_PROVIDER_SITE_OTHER): Payer: 59

## 2020-05-23 ENCOUNTER — Inpatient Hospital Stay (HOSPITAL_COMMUNITY)
Admission: AD | Admit: 2020-05-23 | Discharge: 2020-05-24 | Disposition: A | Discharge status: Home / Self Care | Payer: Medicaid - Out of State | Attending: Obstetrics & Gynecology | Admitting: Obstetrics & Gynecology

## 2020-05-23 ENCOUNTER — Telehealth: Payer: Self-pay

## 2020-05-23 ENCOUNTER — Other Ambulatory Visit: Payer: Self-pay

## 2020-05-23 VITALS — BP 101/64 | HR 86 | Wt 140.1 lb

## 2020-05-23 DIAGNOSIS — O009 Unspecified ectopic pregnancy without intrauterine pregnancy: Secondary | ICD-10-CM | POA: Insufficient documentation

## 2020-05-23 DIAGNOSIS — F17210 Nicotine dependence, cigarettes, uncomplicated: Secondary | ICD-10-CM | POA: Diagnosis not present

## 2020-05-23 DIAGNOSIS — R109 Unspecified abdominal pain: Secondary | ICD-10-CM

## 2020-05-23 DIAGNOSIS — O26891 Other specified pregnancy related conditions, first trimester: Secondary | ICD-10-CM | POA: Diagnosis not present

## 2020-05-23 DIAGNOSIS — R1030 Lower abdominal pain, unspecified: Secondary | ICD-10-CM

## 2020-05-23 DIAGNOSIS — O00101 Right tubal pregnancy without intrauterine pregnancy: Secondary | ICD-10-CM | POA: Diagnosis not present

## 2020-05-23 DIAGNOSIS — O00201 Right ovarian pregnancy without intrauterine pregnancy: Secondary | ICD-10-CM

## 2020-05-23 DIAGNOSIS — Z881 Allergy status to other antibiotic agents status: Secondary | ICD-10-CM | POA: Insufficient documentation

## 2020-05-23 DIAGNOSIS — Z3A01 Less than 8 weeks gestation of pregnancy: Secondary | ICD-10-CM | POA: Insufficient documentation

## 2020-05-23 LAB — CBC
HCT: 34.6 % — ABNORMAL LOW (ref 36.0–46.0)
Hemoglobin: 11.7 g/dL — ABNORMAL LOW (ref 12.0–15.0)
MCH: 33.1 pg (ref 26.0–34.0)
MCHC: 33.8 g/dL (ref 30.0–36.0)
MCV: 98 fL (ref 80.0–100.0)
Platelets: 227 10*3/uL (ref 150–400)
RBC: 3.53 MIL/uL — ABNORMAL LOW (ref 3.87–5.11)
RDW: 12.4 % (ref 11.5–15.5)
WBC: 12.1 10*3/uL — ABNORMAL HIGH (ref 4.0–10.5)
nRBC: 0 % (ref 0.0–0.2)

## 2020-05-23 LAB — BETA HCG QUANT (REF LAB): hCG Quant: 4104 m[IU]/mL

## 2020-05-23 MED ORDER — LACTATED RINGERS IV BOLUS
1000.0000 mL | Freq: Once | INTRAVENOUS | Status: AC
  Administered 2020-05-23: 1000 mL via INTRAVENOUS

## 2020-05-23 MED ORDER — MORPHINE SULFATE (PF) 4 MG/ML IV SOLN
4.0000 mg | Freq: Once | INTRAVENOUS | Status: AC
  Administered 2020-05-23: 4 mg via INTRAVENOUS
  Filled 2020-05-23: qty 1

## 2020-05-23 NOTE — Telephone Encounter (Signed)
Pt called stating she just left from a beta lab and wanted to ask if we could prescribe a zpak for flem.  Left message for pt stating that we will have request that she contacts her PCP or go to Urgent Care due to her needing to be evaluated before medication can be prescribed.  If she has any questions to please give the office a call back.    Leonette Nutting  05/23/20

## 2020-05-23 NOTE — MAU Note (Signed)
Patient reports to MAU via EMS after experiencing sharp pain and then an episode of syncope around 2200.  Rates pain 7/10 as shapr intermittent pains.  Has diagnosed ectopic pregnancy and has received 2 shots of methotrexate.

## 2020-05-23 NOTE — MAU Provider Note (Addendum)
History     CSN: [id number]  Arrival date and time: 05/23/20 2250   Event Date/Time   First Provider Initiated Contact with Patient 05/23/20 2316      Chief Complaint  Patient presents with  . Abdominal Pain  . Loss of Consciousness   Miranda Boyd is a 35 y.o. G5P2020 at [redacted]w[redacted]d with known ectopic pregnancy.  She endorses having 2 doses of MTX and last dose was Tuesday Feb 14th.  Review of chart shows hCG this morning was 4104. She states her last injection was Tuesday.   She presents today for Abdominal Pain and Loss of Consciousness.  She states about one hour ago she started having "severe" abdominal pain that is "everywhere."  She states the pain is worse on the right side and radiates to her back and her buttock area.  She describes the pain as sharp and is intermittent.  She rates the pain a 10/10 when it occurs and 7/10 at rest.  She reports the pain has no aggravating or relieving factors.    OB History    Gravida  5   Para  2   Term  2   Preterm      AB  2   Living        SAB  1   IAB  1   Ectopic      Multiple      Live Births              No past medical history on file.  No past surgical history on file.  No family history on file.  Social History   Tobacco Use  . Smoking status: Current Every Day Smoker    Packs/day: 0.50    Types: Cigarettes  . Smokeless tobacco: Former Engineer, water Use Topics  . Alcohol use: Not Currently  . Drug use: Never    Allergies:  Allergies  Allergen Reactions  . Keflex [Cephalexin] Hives    Medications Prior to Admission  Medication Sig Dispense Refill Last Dose  . acyclovir (ZOVIRAX) 800 MG tablet Take 400 mg by mouth 2 (two) times daily.   05/23/2020 at Unknown time  . traMADol (ULTRAM) 50 MG tablet Take 1 tablet (50 mg total) by mouth every 6 (six) hours as needed. (Patient not taking: No sig reported) 15 tablet 0     Review of Systems  Constitutional: Positive for chills. Negative for fever.   Respiratory: Negative for cough and shortness of breath.   Gastrointestinal: Positive for abdominal pain. Negative for constipation, diarrhea, nausea and vomiting.  Genitourinary: Positive for vaginal bleeding. Negative for difficulty urinating, dysuria and vaginal discharge.  Neurological: Positive for dizziness and headaches. Negative for light-headedness.   Physical Exam   Blood pressure (!) 103/52, pulse 72, temperature 98.1 F (36.7 C), temperature source Oral, resp. rate 18, last menstrual period 04/06/2020, SpO2 100 %.   Vitals:   05/24/20 0004 05/24/20 0005 05/24/20 0141 05/24/20 0146  BP: 113/66  (!) 112/55 (!) 110/57  Pulse: 78  69 69  Resp:      Temp:      TempSrc:      SpO2:  99%      Physical Exam Vitals reviewed. Exam conducted with a chaperone present.  Constitutional:      General: She is not in acute distress.    Appearance: She is well-developed.  HENT:     Head: Normocephalic and atraumatic.  Cardiovascular:     Rate and Rhythm: Normal  rate and regular rhythm.  Pulmonary:     Effort: Pulmonary effort is normal. No respiratory distress.     Breath sounds: Normal breath sounds.  Abdominal:     General: Abdomen is flat. Bowel sounds are normal. There is no distension.     Palpations: There is no mass.     Tenderness: There is abdominal tenderness in the right lower quadrant and suprapubic area.  Genitourinary:    Cervix: Cervical motion tenderness present.     Comments: BME: No blood noted at introitus.  Mild generalized tenderness in cul de sac. +CMT.  Exam glove with small amt blood noted.  Skin:    General: Skin is warm and dry.  Neurological:     Mental Status: She is alert and oriented to person, place, and time.  Psychiatric:        Mood and Affect: Mood normal.        Behavior: Behavior normal.     MAU Course  Procedures Results for orders placed or performed during the hospital encounter of 05/23/20 (from the past 24 hour(s))  CBC      Status: Abnormal   Collection Time: 05/23/20 11:19 PM  Result Value Ref Range   WBC 12.1 (H) 4.0 - 10.5 K/uL   RBC 3.53 (L) 3.87 - 5.11 MIL/uL   Hemoglobin 11.7 (L) 12.0 - 15.0 g/dL   HCT 35.5 (L) 73.2 - 20.2 %   MCV 98.0 80.0 - 100.0 fL   MCH 33.1 26.0 - 34.0 pg   MCHC 33.8 30.0 - 36.0 g/dL   RDW 54.2 70.6 - 23.7 %   Platelets 227 150 - 400 K/uL   nRBC 0.0 0.0 - 0.2 %  Type and screen     Status: None   Collection Time: 05/23/20 11:19 PM  Result Value Ref Range   ABO/RH(D) A POS    Antibody Screen NEG    Sample Expiration      05/26/2020,2359 Performed at [facility], [address], [place], [place] [address]   hCG, quantitative, pregnancy     Status: Abnormal   Collection Time: 05/23/20 11:19 PM  Result Value Ref Range   hCG, Beta Chain, Quant, S 5,898 (H) <5 mIU/mL   US OB Transvaginal  Result Date: 05/24/2020 CLINICAL DATA:  History of known ectopic pregnancy. EXAM: TRANSVAGINAL OB ULTRASOUND TECHNIQUE: Transvaginal ultrasound was performed for complete evaluation of the gestation as well as the maternal uterus, adnexal regions, and pelvic cul-de-sac. COMPARISON:  May 12, 2020 FINDINGS: Intrauterine gestational sac: None Yolk sac:  Not Visualized. Embryo:  Not Visualized. Cardiac Activity: Not Visualized. Heart Rate: N/A bpm Subchorionic hemorrhage:  None visualized. Maternal uterus/adnexae: The right ovary measures 3.3 cm x 2.3 cm x 2.3 cm. A 3.1 cm x 2.0 cm x 2.0 cm heterogeneous echogenic mass is seen inferior and medial to the right ovary. This area measures approximately 1.6 cm x 1.7 cm x 1.4 cm on the prior study. The left ovary measures 3.1 cm x 1.8 cm x 1.4 cm and is normal in appearance. A trace amount of pelvic free fluid is noted. IMPRESSION: Findings consistent with the patient's known right sided ectopic pregnancy. Electronically Signed   By: Aram Candela M.D.   On: 05/24/2020 00:17    MDM Physical Exam Labs: CBC, hCG, T&S Ultrasound Start IV with  LR Bolus Pain Medication Antiemetic Assessment and Plan  35 year old Known Ectopic Abdominal Pain  -POC Reviewed. -Nurse instructed to start IV and  give fluids prior to provider to bedside. -hCG, CBC, and T&S ordered. -Exam performed and findings discussed. -Will give morphine for pain. -[place] to r/o ruptured ectopic.  -Will await results and reassess.   Cherre Robins 05/23/2020, 11:16 PM   Reassessment (1:15 AM)  -[place] images and results reviewed.  -hCG returns at 5898 -Dr. Despina Hidden consulted and informed of patient status, interventions, results, and recommendation for surgical intervention.  Advised: *No need for surgical intervention at current. *Educates provider on anticipated rise in quant on Day 4. *States patient should continue with outpatient follow up and take pain medication as prescribed. *Okay for Toradol script if desired. -Provider to bedside to discuss results with patient who reports improvement in pain. -Reviewed POC to include continued outpatient mgmt and Tramadol usage.  -Patient requests transfer of prescription to another pharmacy and instructed to call initial pharmacy and have prescription transferred. -Reviewed precautions and strongly advised to return to MAU for worsening of pain, vaginal bleeding, or continued syncope episodes. -Patient verbalizes understanding and without further questions or concerns. -Nurse reports patient with some hypotension.  Review of chart and previous visits shows low normal blood pressures. -Nurse reports patient with c/o nausea. -Will give IV Zofran. -Nurse instructed to discharge after administration. -Encouraged to call or return to MAU if symptoms worsen or with the onset of new symptoms. -Discharged to home in stable condition.  Cherre Robins MSN, CNM Advanced Practice Provider, [facility]

## 2020-05-23 NOTE — Progress Notes (Signed)
Here today for MTX DAY 4 beta hCG lab draw; this is 2nd round MTX. Pt reports vaginal bleeding, changing pad for comfort every few hours. Denies any pain. Phone number verified and lab drawn.   Beta hCG today is 4104, which has decreased from 5526 on DAY 1 (05/20/20). Reviewed with Anyanwu, MD who recommends pt return for DAY 7 beta hCG. Appt scheduled for 05/26/20 at 0830. Called pt with results and provider recommendation. Pt aware of when to return to MAU for evaluation.  Fleet Contras RN 05/23/20

## 2020-05-24 LAB — HCG, QUANTITATIVE, PREGNANCY: hCG, Beta Chain, Quant, S: 5898 m[IU]/mL — ABNORMAL HIGH (ref <5)

## 2020-05-24 LAB — TYPE AND SCREEN
ABO/RH(D): A POS
Antibody Screen: NEGATIVE

## 2020-05-24 MED ORDER — ONDANSETRON HCL 4 MG/2ML IJ SOLN
4.0000 mg | Freq: Once | INTRAMUSCULAR | Status: AC
  Administered 2020-05-24: 4 mg via INTRAVENOUS
  Filled 2020-05-24: qty 2

## 2020-05-24 NOTE — Discharge Instructions (Signed)
Ectopic Pregnancy  An ectopic pregnancy happens when a fertilized egg attaches (implants) outside the uterus. In a normal pregnancy, a fertilized egg implants in the uterus. An ectopic pregnancy cannot develop into a healthy baby. Most ectopic pregnancies occur in one of the fallopian tubes, which is where an egg travels from an ovary to get to the uterus. This is called a tubal pregnancy. An ectopic pregnancy can also happen on an ovary, on the cervix, or in the abdomen. When a fertilized egg implants on tissue outside the uterus and begins to grow, it may cause the tissue to tear or burst. This is known as a ruptured ectopic pregnancy. The tear or burst causes internal bleeding. This may cause intense pain in the abdomen. An ectopic pregnancy is a medical emergency and can be life-threatening. What are the causes? The most common cause of this condition is damage to one of the fallopian tubes. A fallopian tube may be narrowed or blocked, and that stops the fertilized egg from reaching the uterus. Sometimes, the cause of this condition is not known. What increases the risk? The following factors may make you more likely to develop this condition:  Having gone through infertility treatment before.  Having had an ectopic pregnancy before.  Having had surgery to have the fallopian tubes tied.  Becoming pregnant while using an intrauterine device for birth control.  Taking birth control pills before the age of 16. Other risk factors include:  Smoking.  Alcohol use.  History of DES exposure. DES is a medicine that was used until 1971 and affected babies whose mothers took the medicine. What are the signs or symptoms? Common symptoms of this condition include:  Missing a menstrual period.  Nausea or tiredness.  Tender breasts.  Other normal pregnancy symptoms. Other symptoms may include:  Pain during sex.  Vaginal bleeding or spotting.  Cramping or pain in the lower abdomen.  A  fast heartbeat, low blood pressure, and sweating.  Pain or increased pressure while having a bowel movement. Symptoms of a ruptured ectopic pregnancy and internal bleeding may include:  Sudden, severe pain in the abdomen.  Dizziness, weakness, feeling light-headed, or fainting.  Pain in the shoulder or neck area. How is this diagnosed? This condition is diagnosed by:  A blood test to check for the pregnancy hormone.  A pelvic exam to find painful areas or a mass in the abdomen.  Ultrasound. A probe is inserted into the vagina to see if there is a pregnancy in or outside the uterus.  Taking a sample of tissue from the uterus.  Surgery to look closely at the fallopian tubes through an incision in the abdomen. How is this treated? This condition is usually treated with medicine or surgery. Sometimes, ectopic pregnancies can resolve on their own, under close monitoring by your health care provider. Medicine A medicine called methotrexate may be given to cause the pregnancy tissue to be absorbed. The medicine may be given if:  The diagnosis is made early, with no signs of active bleeding.  The fallopian tube has not torn or burst. You will need blood tests to make sure the medicine is working. It may take 4-6 weeks for the pregnancy tissues to be absorbed. Surgery Surgery may be performed to:  Remove the pregnancy tissue.  Stop internal bleeding.  Remove part or all of the fallopian tube.  Remove the uterus. This is rare. After surgery, you may need to have blood tests to make sure the surgery worked.   Follow these instructions at home: Medicines  Take over-the-counter and prescription medicines only as told by your health care provider.  Ask your health care provider if the medicine prescribed to you: ? Requires you to avoid driving or using machinery. ? Can cause constipation. You may need to take these actions to prevent or treat constipation:  Drink enough fluid to  keep your urine pale yellow.  Take over-the-counter or prescription medicines.  Eat foods that are high in fiber, such as beans, whole grains, and fresh fruits and vegetables.  Limit foods that are high in fat and processed sugars, such as fried or sweet foods. General instructions  Rest or limit your activity, if told by your health care provider.  Do not have sex or put anything in your vagina, such as tampons or douches, for 6 weeks or until your health care provider says it is safe.  Do not lift anything that is heavier than 10 lb (4.5 kg), or the limit that you are told, until your health care provider says that it is safe.  Return to your normal activities as told by your health care provider. Ask your health care provider what activities are safe for you.  Keep all follow-up visits. This is important. Contact a health care provider if:  You have a fever or chills.  You have nausea and vomiting. Get help right away if:  Your pain gets worse or is not relieved by medicine.  You feel dizzy or weak.  You feel light-headed or you faint.  You have sudden, severe pain in your abdomen.  You have sudden pain in the shoulder or neck area. Summary  An ectopic pregnancy happens when a fertilized egg implants outside the uterus. Most ectopic pregnancies occur in one of the fallopian tubes.  An ectopic pregnancy is a medical emergency and can be life-threatening.  The most common cause of this condition is damage to one of the fallopian tubes.  This condition is usually treated with medicine or surgery. Some ectopic pregnancies resolve on their own, under close monitoring by your health care provider. This information is not intended to replace advice given to you by your health care provider. Make sure you discuss any questions you have with your health care provider. Document Revised: 07/03/2019 Document Reviewed: 07/03/2019 Elsevier Patient Education  2021 Elsevier Inc.  

## 2020-05-26 ENCOUNTER — Other Ambulatory Visit: Payer: Self-pay

## 2020-05-26 ENCOUNTER — Ambulatory Visit (INDEPENDENT_AMBULATORY_CARE_PROVIDER_SITE_OTHER): Payer: 59 | Admitting: General Practice

## 2020-05-26 DIAGNOSIS — O00101 Right tubal pregnancy without intrauterine pregnancy: Secondary | ICD-10-CM | POA: Diagnosis not present

## 2020-05-26 LAB — BETA HCG QUANT (REF LAB): hCG Quant: 3037 m[IU]/mL

## 2020-05-26 NOTE — Progress Notes (Signed)
Patient was assessed and managed by nursing staff during this encounter. I have reviewed the chart and agree with the documentation and plan. I have also made any necessary editorial changes.  Labrenda Lasky, MD 05/26/2020 6:13 PM 

## 2020-05-26 NOTE — Progress Notes (Signed)
Patient presents to office today for day #7 labs following MTX on 2/15. Patient reports continued generalized pelvic pain rated at a 5 and light bleeding. Discussed with patient we are monitoring your bhcg levels today, results take approximately 2 hours to finalize and will be reviewed with a provider in the office. Discussed we will then call you and review results/updated plan of care. Patient verbalized understanding.   Reviewed results with Dr Debroah Loop who finds sufficient decreased in bhcg levels. Patient will need repeat bhcg in 1 week to continue to follow trend.  Called patient, no answer- left message to call office back for results. Will try again later.  Called patient & informed her of results. Discussed follow up bhcg in 1 week to continue to follow levels down. Patient verbalized understanding and states she can come 2/28 @ 1030.   Chase Caller RN BSN 05/26/20

## 2020-05-27 NOTE — Telephone Encounter (Signed)
error 

## 2020-05-30 ENCOUNTER — Other Ambulatory Visit: Payer: Self-pay | Admitting: *Deleted

## 2020-05-30 DIAGNOSIS — O00201 Right ovarian pregnancy without intrauterine pregnancy: Secondary | ICD-10-CM

## 2020-06-02 ENCOUNTER — Other Ambulatory Visit: Payer: Self-pay

## 2020-06-02 ENCOUNTER — Other Ambulatory Visit: Payer: 59

## 2020-06-02 DIAGNOSIS — O00201 Right ovarian pregnancy without intrauterine pregnancy: Secondary | ICD-10-CM

## 2020-06-03 LAB — BETA HCG QUANT (REF LAB): hCG Quant: 1250 m[IU]/mL

## 2020-06-04 ENCOUNTER — Telehealth: Payer: Self-pay | Admitting: *Deleted

## 2020-06-04 NOTE — Telephone Encounter (Signed)
Pt left message that she has not received a call regarding her recent BHCG result. Please call back

## 2020-06-05 NOTE — Telephone Encounter (Signed)
Called patient and gave her Beta Hcg Level. Reviewed levels are dropping as expected.   Patient reports she is still experienceing some pain and bleeding. Reviewed some women can have pain and bleeding for up to 6 weeks.   She reports she and her family have moved out of state. Reviewed with patient it is very important that she follow up with an OB/GYN in her new area ASAP.   Gave fax number so new office can get access to her records

## 2020-06-23 ENCOUNTER — Telehealth: Payer: Self-pay | Admitting: Family Medicine

## 2020-06-23 DIAGNOSIS — O00201 Right ovarian pregnancy without intrauterine pregnancy: Secondary | ICD-10-CM

## 2020-06-23 NOTE — Telephone Encounter (Signed)
Returned patients call. Patient voiced that she want to set up an appointment for a blood draw. She has not checked her Hcg levels since the last one.   She has moved back to the area and did not seek care when she was out of town.   Patient reports she is still having some light bleeding. The discharge is brown and does have a smell.   She does not have any pain. She is still taking Tylenol prn.   She has not had levels drawn since 2/28 and would like follow up . Spoke with Mindi Junker, NP. Will bring in for non stat Beta tomorrow at 11 am and then follow up with patient accordingly.

## 2020-06-23 NOTE — Telephone Encounter (Signed)
Patient called requesting an order for labs, she want her HCG checked, she also state she is still bleeding

## 2020-06-24 ENCOUNTER — Other Ambulatory Visit: Payer: Self-pay

## 2020-06-24 DIAGNOSIS — O00201 Right ovarian pregnancy without intrauterine pregnancy: Secondary | ICD-10-CM

## 2020-06-25 LAB — BETA HCG QUANT (REF LAB): hCG Quant: 41 m[IU]/mL

## 2020-06-27 ENCOUNTER — Telehealth: Payer: Self-pay

## 2020-06-27 NOTE — Telephone Encounter (Addendum)
-----   Message from Lewayne Bunting, [place] sent at 06/27/2020 10:51 AM EDT -----  ----- Message ----- From: Currie Paris, NP Sent: 06/26/2020   9:41 PM EDT To: [facility] Clinical Pool  Call patient and schedule for repeat quant weekly until zero.   Patient called and left VM on nurse line requesting lab results from recent appt. Pt states she leaves messages regularly and does not receive a phone call back. Called pt with results. Pt will return for lab appt on 07/01/20. Front office notified.

## 2020-07-01 ENCOUNTER — Other Ambulatory Visit: Payer: 59

## 2020-07-01 ENCOUNTER — Other Ambulatory Visit: Payer: Self-pay | Admitting: Lactation Services

## 2020-07-01 DIAGNOSIS — O00201 Right ovarian pregnancy without intrauterine pregnancy: Secondary | ICD-10-CM

## 2020-07-03 ENCOUNTER — Other Ambulatory Visit: Payer: 59

## 2020-07-07 ENCOUNTER — Other Ambulatory Visit: Payer: Self-pay

## 2020-07-07 ENCOUNTER — Other Ambulatory Visit: Payer: Self-pay | Admitting: *Deleted

## 2020-07-07 ENCOUNTER — Other Ambulatory Visit: Payer: 59

## 2020-07-07 DIAGNOSIS — O00101 Right tubal pregnancy without intrauterine pregnancy: Secondary | ICD-10-CM

## 2020-07-07 NOTE — Progress Notes (Signed)
Entered in error

## 2020-07-08 ENCOUNTER — Telehealth: Payer: Self-pay

## 2020-07-08 LAB — BETA HCG QUANT (REF LAB): hCG Quant: 9 m[IU]/mL

## 2020-07-08 NOTE — Telephone Encounter (Signed)
Miranda Paris, NP  P [facility] Clinical Pool Please call patient and let her know she needs one more lab draw and a follow up visit with a provider to discuss plan of care after this ectopic pregnancy.

## 2020-07-09 NOTE — Telephone Encounter (Signed)
Spoke to patient and inform on results.  Patient is aware she will be schedule an OV in 2 weeks and have her lab drawn at the OV.    Miranda Boyd, [place] 07/09/2020.

## 2020-07-31 ENCOUNTER — Ambulatory Visit (INDEPENDENT_AMBULATORY_CARE_PROVIDER_SITE_OTHER): Payer: 59 | Admitting: Nurse Practitioner

## 2020-07-31 ENCOUNTER — Encounter: Payer: Self-pay | Admitting: Nurse Practitioner

## 2020-07-31 ENCOUNTER — Other Ambulatory Visit: Payer: Self-pay

## 2020-07-31 VITALS — BP 105/66 | HR 79 | Wt 144.3 lb

## 2020-07-31 DIAGNOSIS — O00201 Right ovarian pregnancy without intrauterine pregnancy: Secondary | ICD-10-CM

## 2020-07-31 NOTE — Progress Notes (Signed)
   GYNECOLOGY OFFICE VISIT NOTE   History:  35 y.o. [id number] here today for follow up from ectopic pregnancy.  Past notes reviewed - had 2 doses of methotrexate and last blood draw one week ago - bhcg was 9. Has has had one period.  She denies any abnormal vaginal discharge, bleeding, pelvic pain or other concerns.  Does not want any more children.  Plans to have Medicaid from [place] changed to [facility] and then can make an appointment in the office with MD to be scheduled for outpatient BTL.  Has not had any more syncopal episodes since her visit at [facility] and has not had any further pain.  No past medical history on file.  No past surgical history on file.  The following portions of the patient's history were reviewed and updated as appropriate: allergies, current medications, past family history, past medical history, past social history, past surgical history and problem list.     Review of Systems:  Pertinent items noted in HPI and remainder of comprehensive ROS otherwise negative.  Objective:  Physical Exam BP 105/66   Pulse 79   Wt 144 lb 4.8 oz (65.5 kg)   LMP 07/23/2020   Breastfeeding Unknown   BMI 24.77 kg/m  CONSTITUTIONAL: Well-developed, well-nourished female in no acute distress.  HENT:  Normocephalic, atraumatic. External right and left ear normal.  EYES: Conjunctivae and EOM are normal. Pupils are equal, round.  No scleral icterus.  NECK: Normal range of motion, supple, no masses SKIN: Skin is warm and dry. No rash noted. Not diaphoretic. No erythema. No pallor. NEUROLOGIC: Alert and oriented to person, place, and time. Normal muscle tone coordination. No cranial nerve deficit noted. PSYCHIATRIC: Normal mood and affect. Normal behavior. Normal judgment and thought content. CARDIOVASCULAR: Normal heart rate noted RESPIRATORY: Effort and breath sounds normal, no problems with respiration noted PELVIC: Deferred MUSCULOSKELETAL: Normal range of motion. No edema  noted.  Labs and Imaging No results found.  Assessment & Plan:  1. Ectopic pregnancy of right ovary No further pain or syncopal symptoms Will establish [place] Medicaid and plan is to return when insurance is established to have outpatient BTL scheduled In the meantime, she plans to use condoms with intercourse.  - Beta hCG quant (ref lab)  Routine preventative health maintenance measures emphasized. Please refer to After Visit Summary for other counseling recommendations.    Total face-to-face time with patient: 10 minutes.  Over 50% of encounter was spent on counseling and coordination of care.  Nolene Bernheim, RN, MSN, NP-BC Nurse Practitioner, [facility] for [employer], [facility] 07/31/2020 3:25 PM

## 2020-08-01 LAB — BETA HCG QUANT (REF LAB): hCG Quant: <1 m[IU]/mL

## 2021-03-01 IMAGING — US US OB < 14 WEEKS - US OB TV
1 series · 13 of 28 positions shown · non-contrast
Comparison: None.

CLINICAL DATA: Pregnant patient in first-trimester pregnancy with
vaginal bleeding. Beta HCG [DATE]. gestational age by LMP 4 weeks 3
days.

EXAM:
OBSTETRIC <14 WK US AND TRANSVAGINAL OB US
TECHNIQUE: Both transabdominal and transvaginal ultrasound examinations were
performed for complete evaluation of the gestation as well as the
maternal uterus, adnexal regions, and pelvic cul-de-sac.
Transvaginal technique was performed to assess early pregnancy.

[Series 1: us ob < 14 weeks - us ob tv · 13 of 68 slices shown]
[im 3/68]
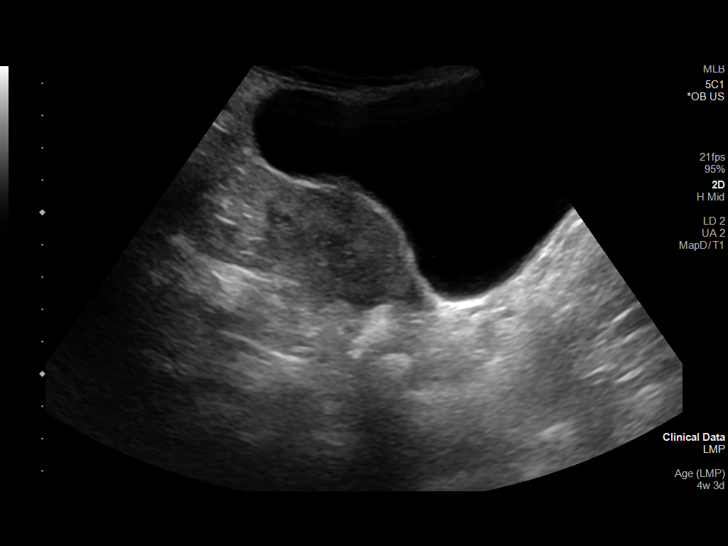
[im 8/68]
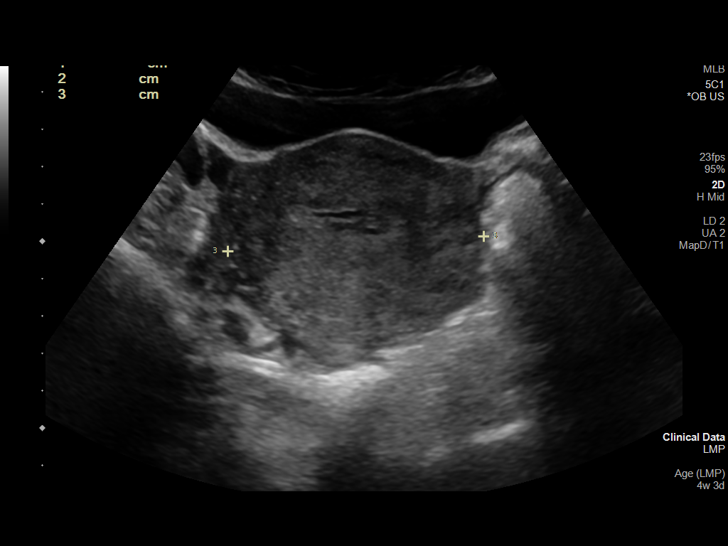
[im 13/68]
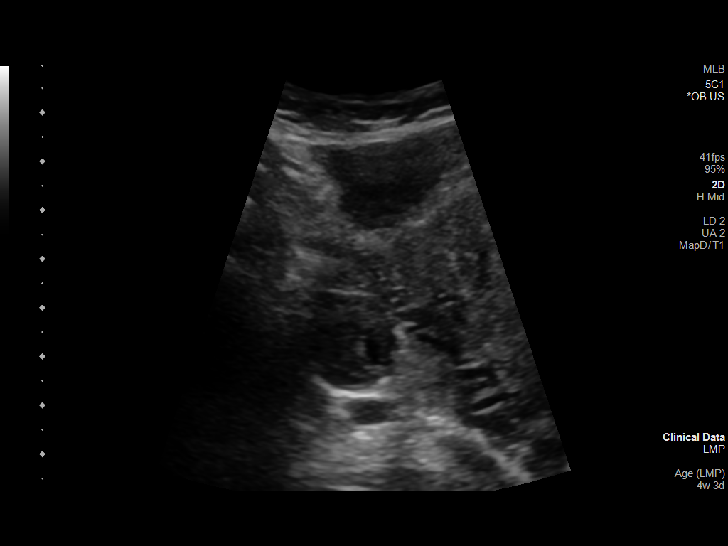
[im 18/68]
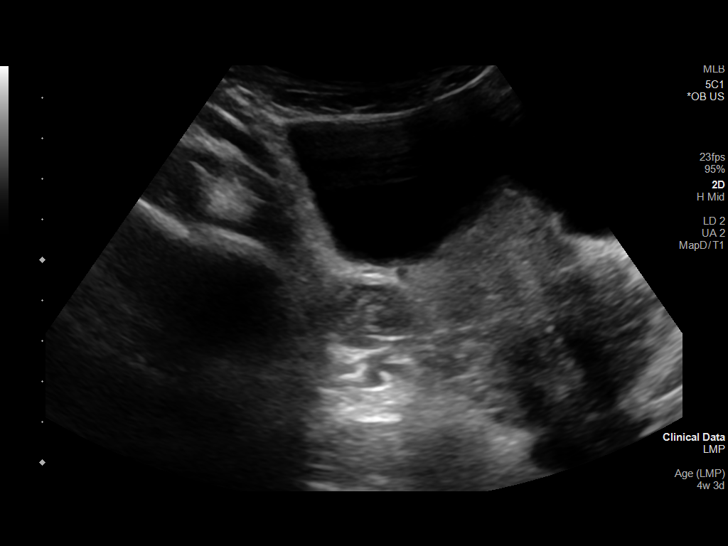
[im 23/68]
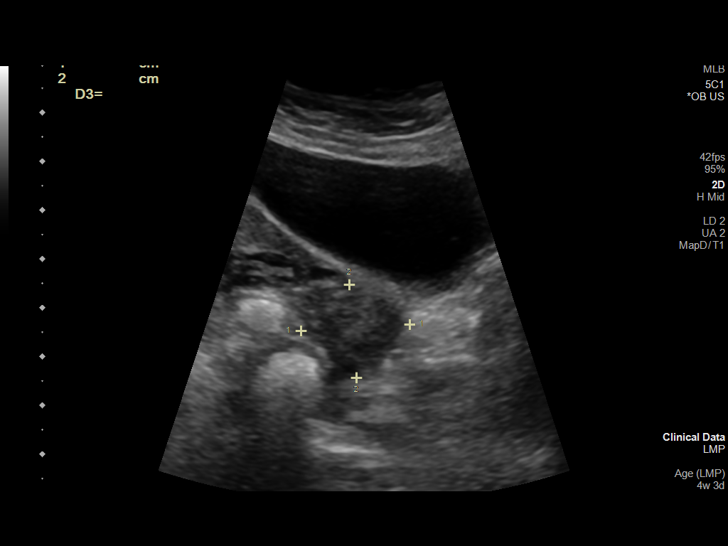
[im 28/68]
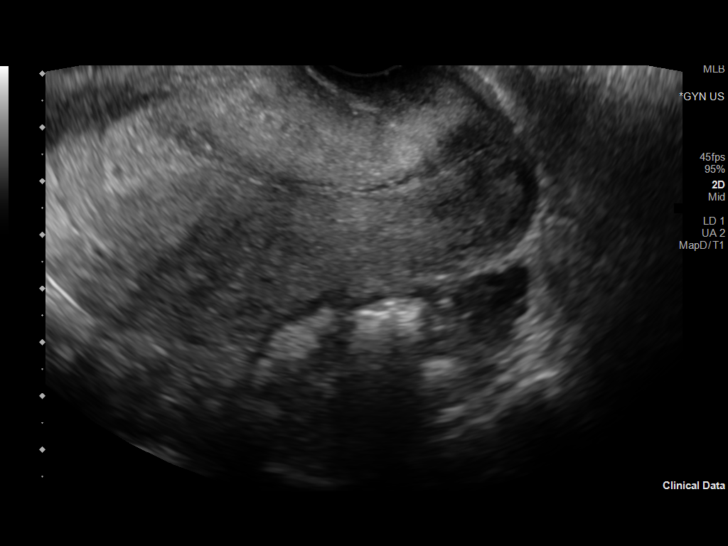
[im 35/68]
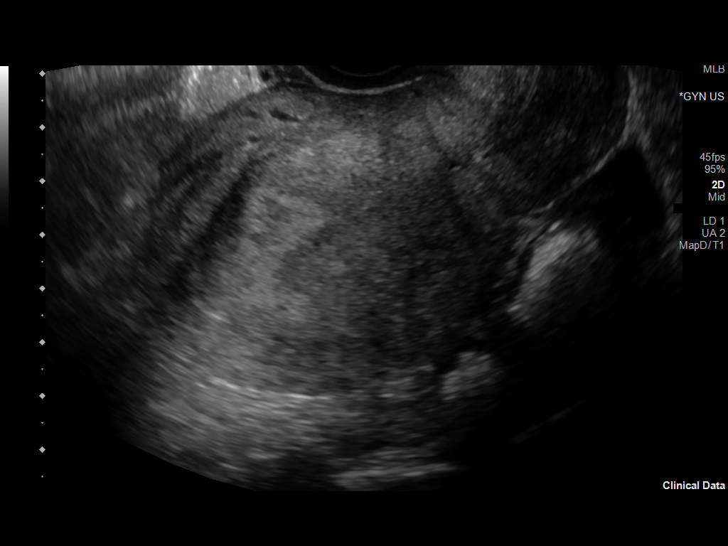
[im 40/68]
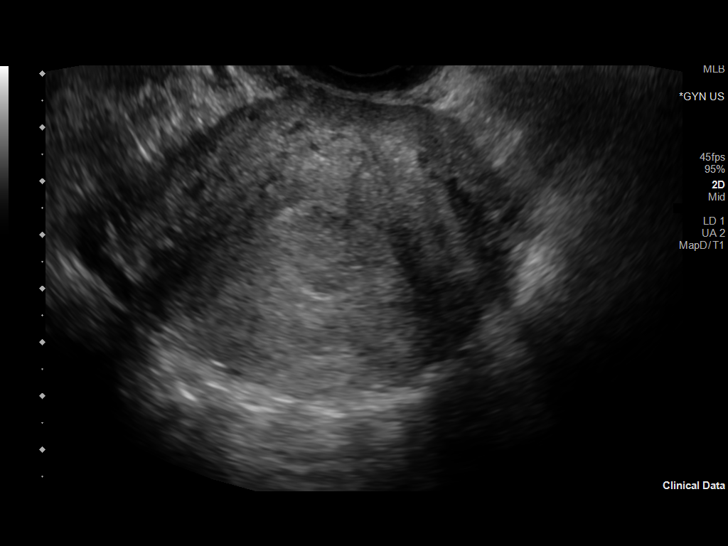
[im 45/68]
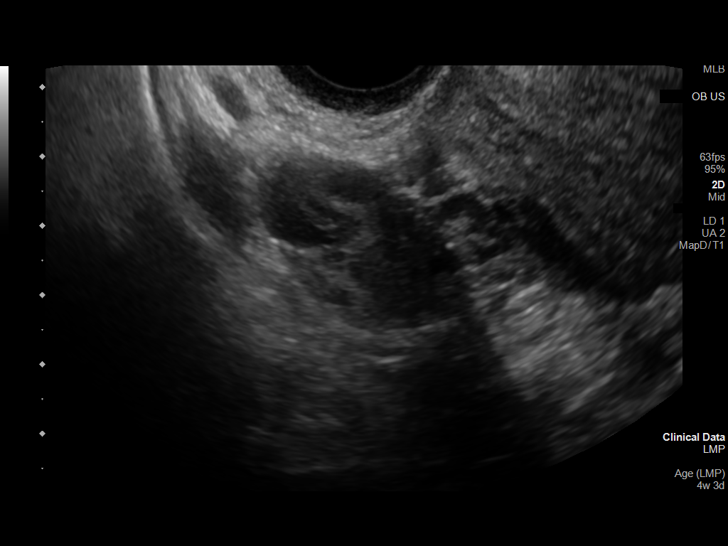
[im 50/68]
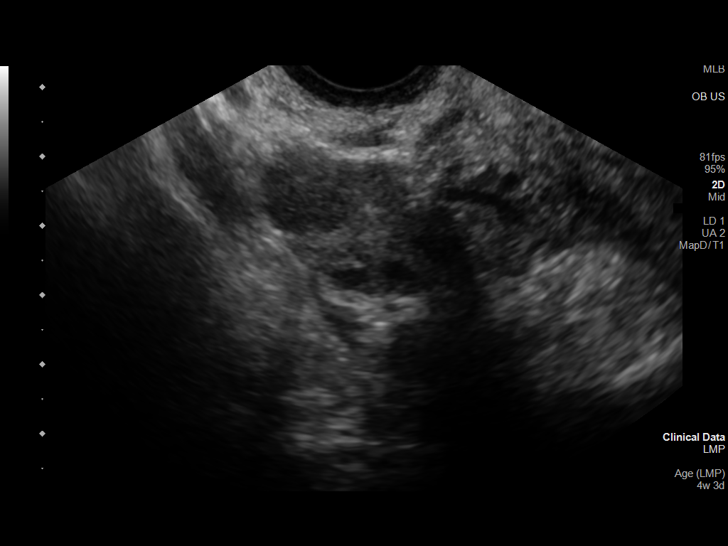
[im 55/68]
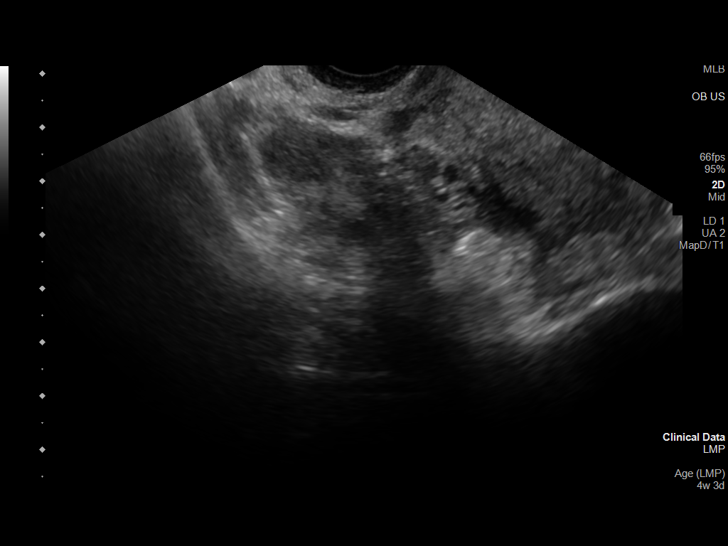
[im 60/68]
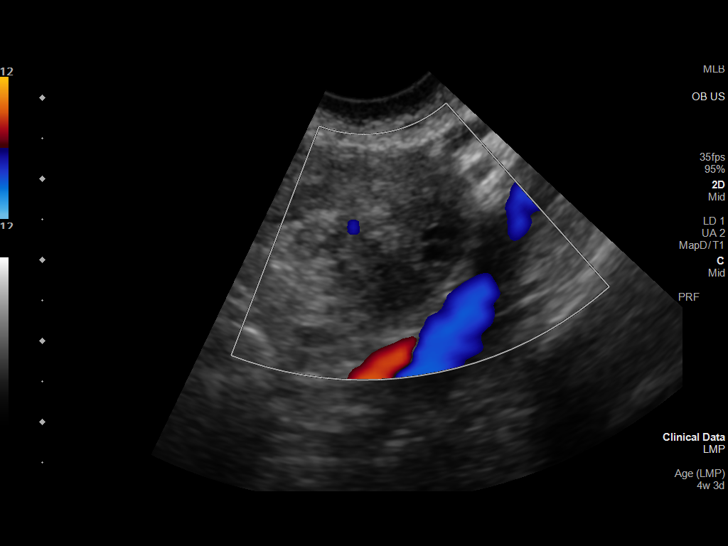
[im 65/68]
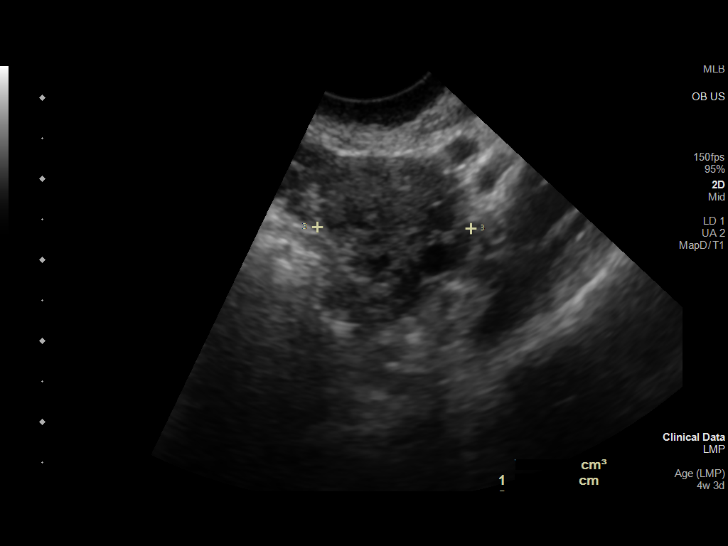

[13 of 28 positions shown; findings below may reference images not displayed]

FINDINGS: Intrauterine gestational sac: None

Yolk sac:  Not Visualized.

Embryo:  Not Visualized.

Maternal uterus/adnexae: The uterus is anteverted. The fundal
endometrium is thickened at 15 mm and heterogeneous. There are few
tiny cystic areas in the thickened endometrium. Endometrium in the
uterine body and lower uterine segment is thin. There is no
intrauterine gestational sac. Both ovaries are visualized and are
normal. The right ovary measures 3.1 x 1.9 x 2.0 cm. The left ovary
measures 2.7 x 1.9 x 1.9 cm. Blood flow seen to both ovaries. No
pelvic free fluid. No evidence of adnexal mass.
IMPRESSION: 1. No intrauterine pregnancy or findings suspicious for ectopic
pregnancy. Findings are consistent with pregnancy of unknown
location and may reflect early intrauterine pregnancy not yet
visualized sonographically, occult ectopic pregnancy, or failed
pregnancy. Recommend trending of beta HCG. Recommend follow-up
ultrasound in 7-10 days as indicated.
2. Fundal endometrium is thickened, heterogeneous with a few small
cystic areas, nonspecific. Recommend attention at follow-up.

## 2021-03-06 IMAGING — US US OB TRANSVAGINAL
1 series · 15 of 28 positions shown · non-contrast
Comparison: None.

CLINICAL DATA: Abdominal pain, vaginal bleeding

EXAM:
TRANSVAGINAL OB ULTRASOUND
TECHNIQUE: Transvaginal ultrasound was performed for complete evaluation of the
gestation as well as the maternal uterus, adnexal regions, and
pelvic cul-de-sac.

[Series 1: us ob transvaginal · 15 of 53 slices shown]
[im 1/53]
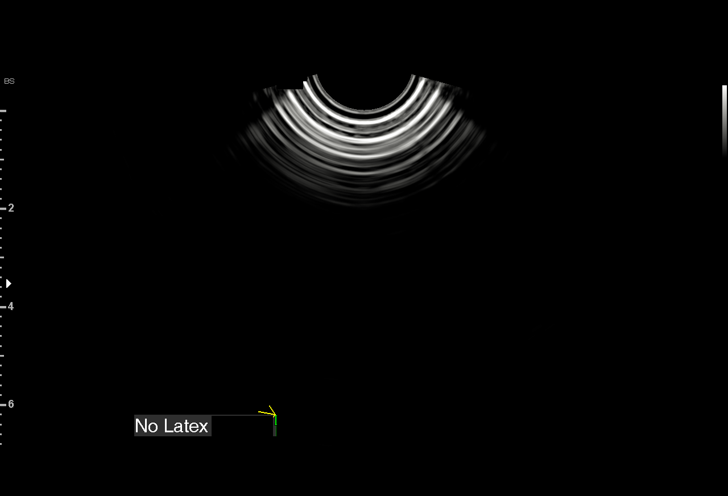
[im 4/53]
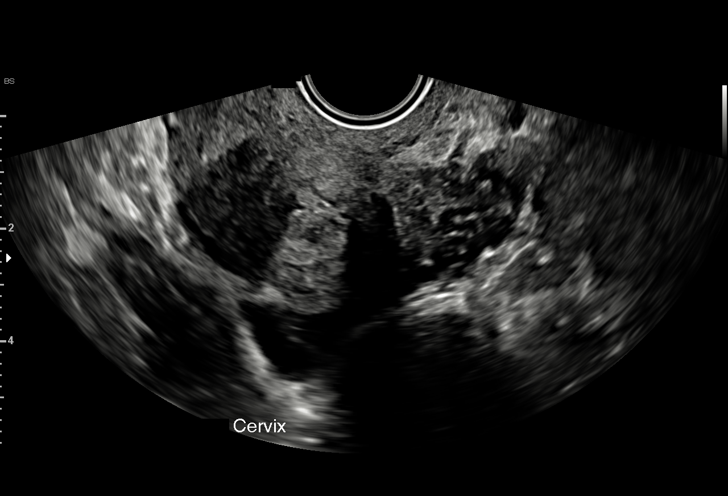
[im 8/53]
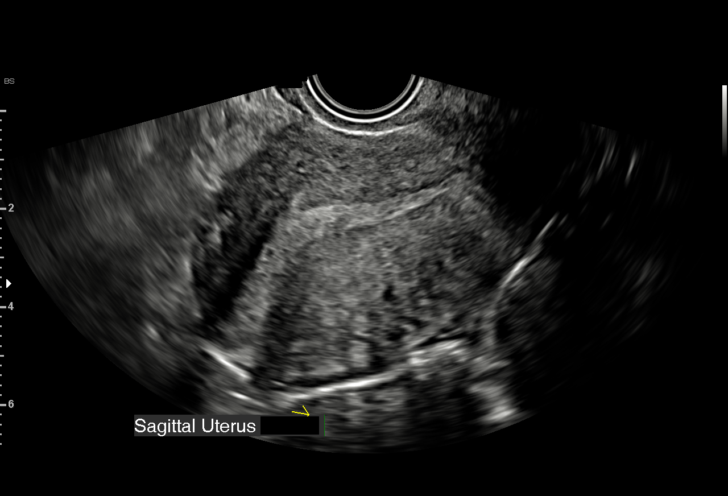
[im 12/53]
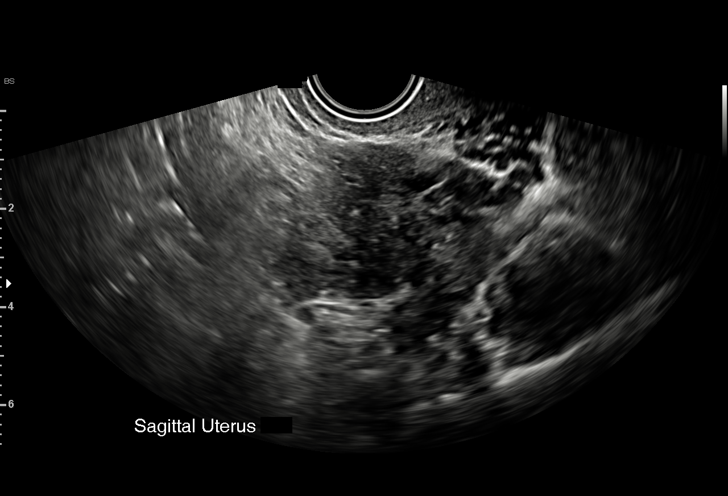
[im 16/53]
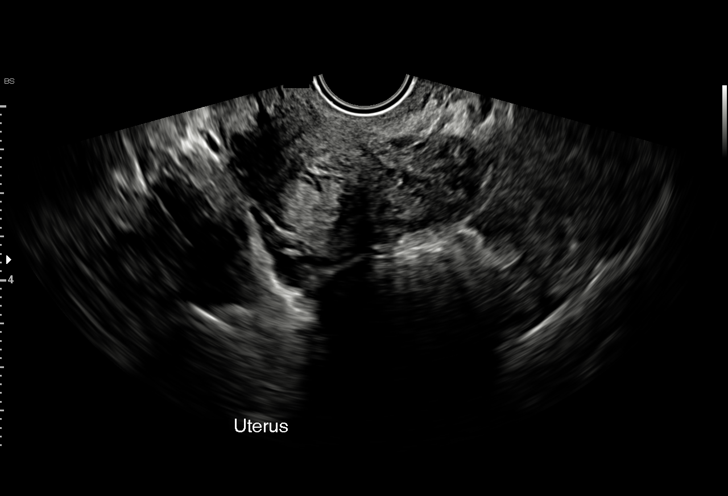
[im 20/53]
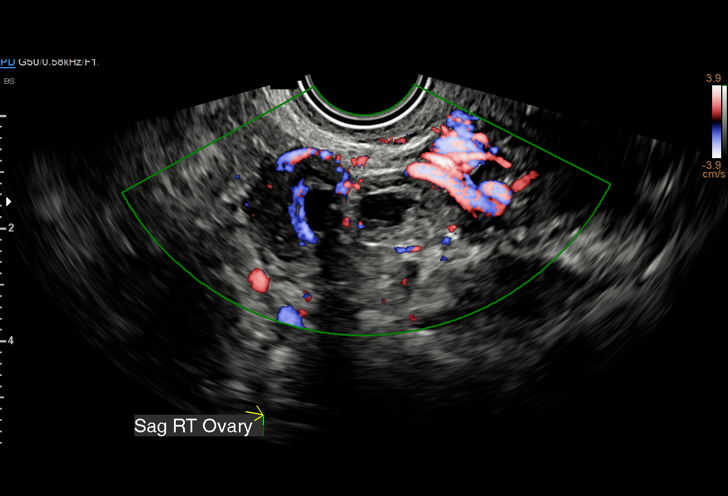
[im 24/53]
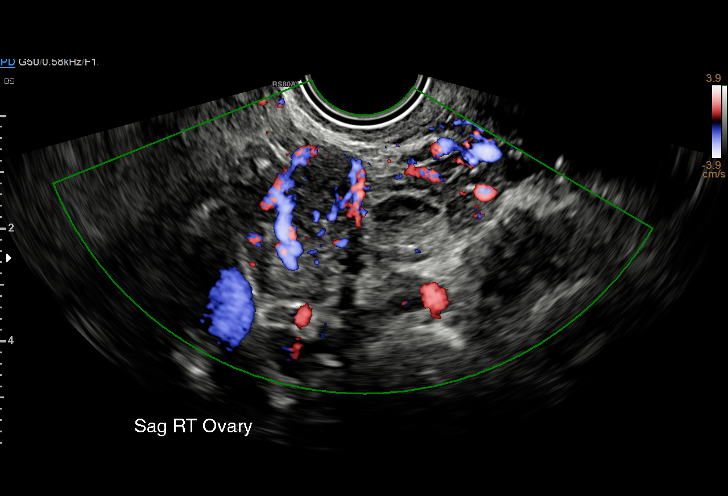
[im 27/53]
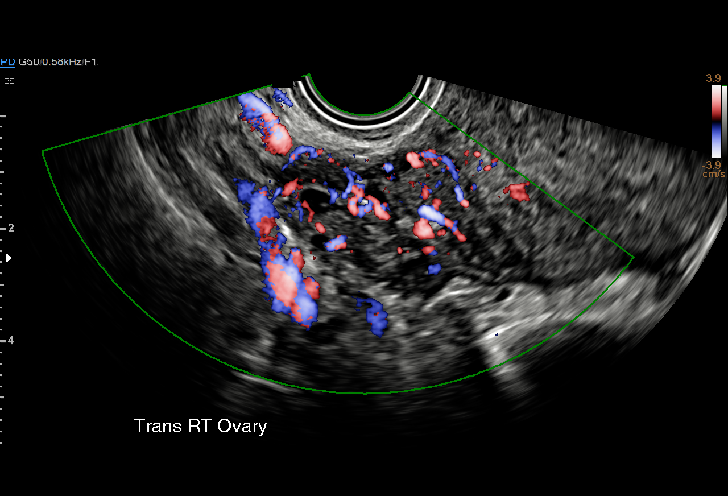
[im 29/53]
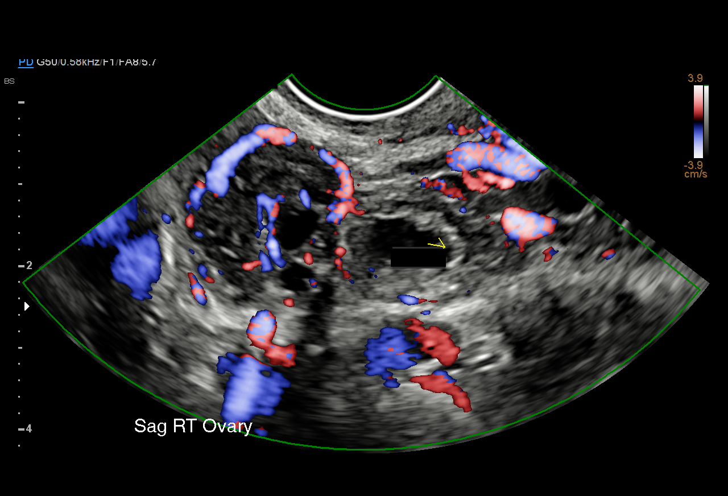
[im 33/53]
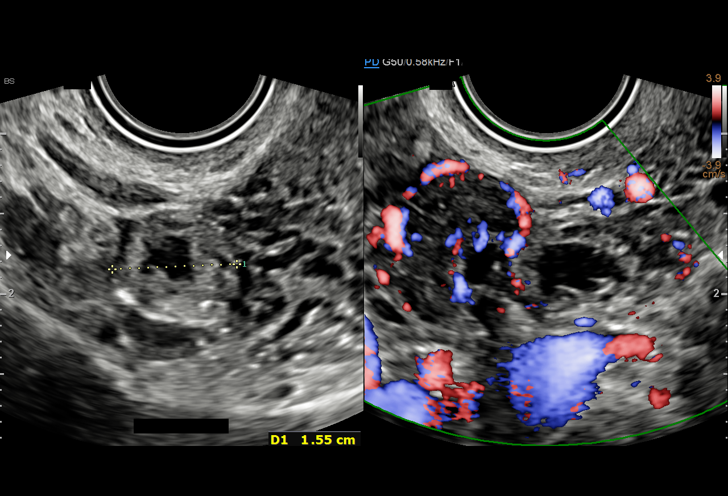
[im 37/53]
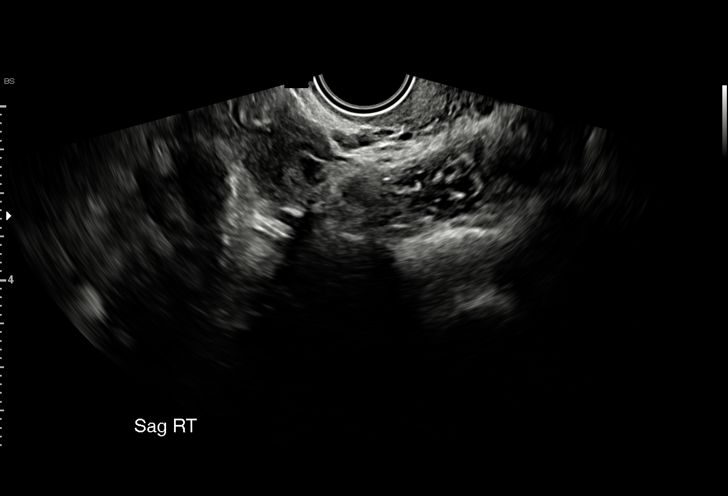
[im 41/53]
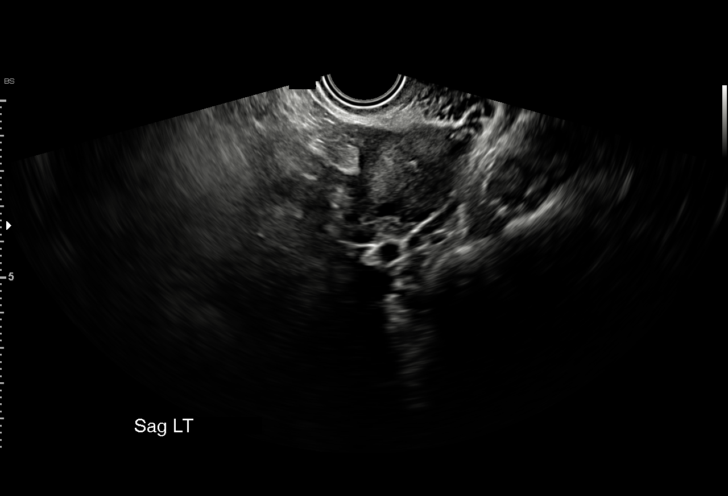
[im 45/53]
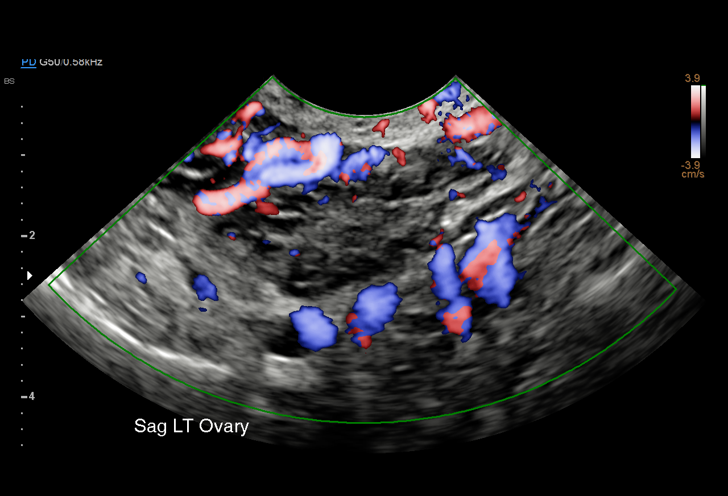
[im 49/53]
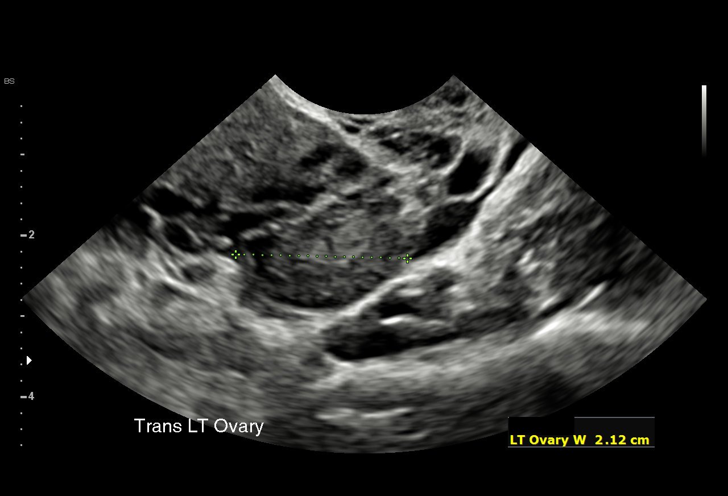
[im 53/53]
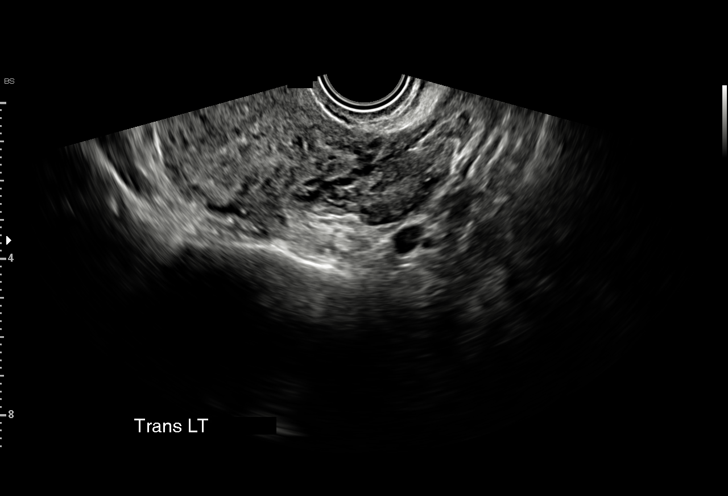

[15 of 28 positions shown; findings below may reference images not displayed]

FINDINGS: Intrauterine gestational sac: None

Yolk sac:  Visualized.

Embryo:  Visualized.

Cardiac Activity: Visualized.

Heart Rate:  bpm

MSD:   mm    w     d

CRL:     mm    w  d                  US EDC:

Subchorionic hemorrhage:  None visualized.

Maternal uterus/adnexae: Within the right adnexa adjacent to the
right ovary there is a 1.7 x 1.4 x 1.6 cm thick walled cystic area
concerning for gestational sac. Possible early yolk sac. No free
fluid.
IMPRESSION: No intrauterine pregnancy.

Concern for right ectopic pregnancy measuring 1.7 cm.

Critical Value/emergent results were called by telephone at the time
of interpretation on 05/12/2020 at [DATE] to provider IBAA
KRISTEL , who verbally acknowledged these results.

## 2021-03-17 IMAGING — US US OB TRANSVAGINAL
1 series · 15 of 28 positions shown · non-contrast
Comparison: May 12, 2020

CLINICAL DATA: History of known ectopic pregnancy.

EXAM:
TRANSVAGINAL OB ULTRASOUND
TECHNIQUE: Transvaginal ultrasound was performed for complete evaluation of the
gestation as well as the maternal uterus, adnexal regions, and
pelvic cul-de-sac.

[Series 1: us ob transvaginal · 15 of 34 slices shown]
[im 1/34]
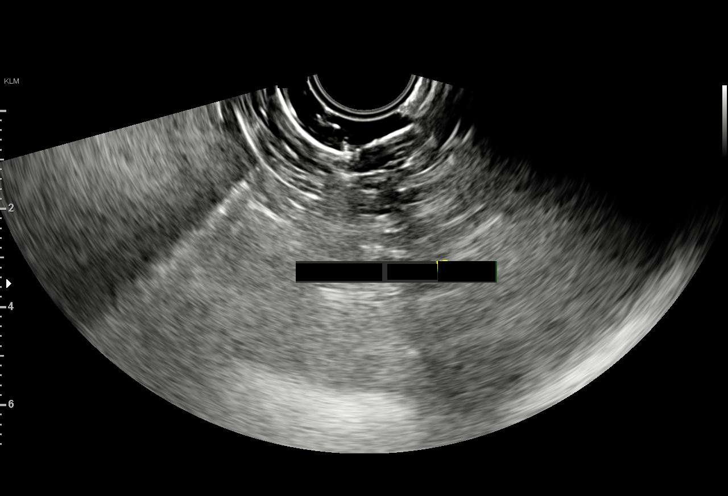
[im 3/34]
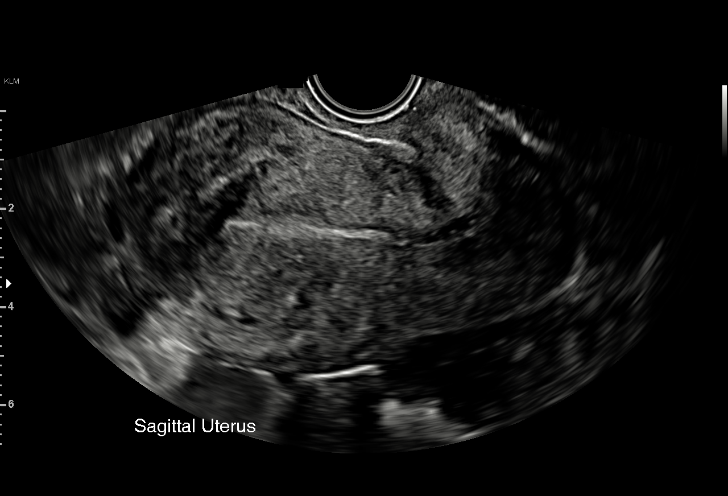
[im 5/34]
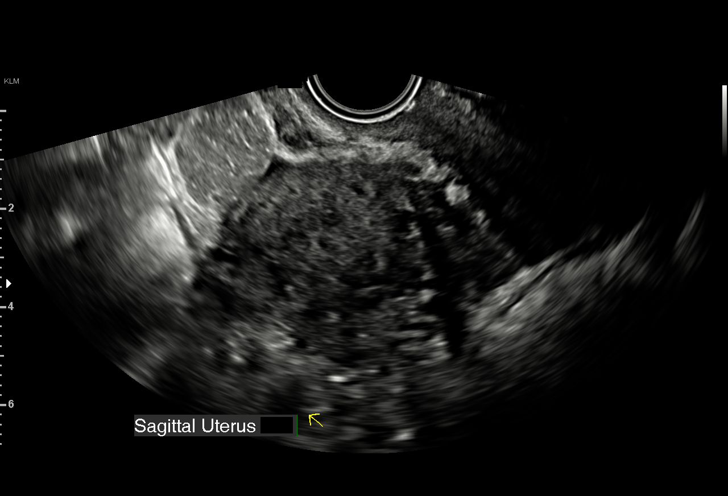
[im 8/34]
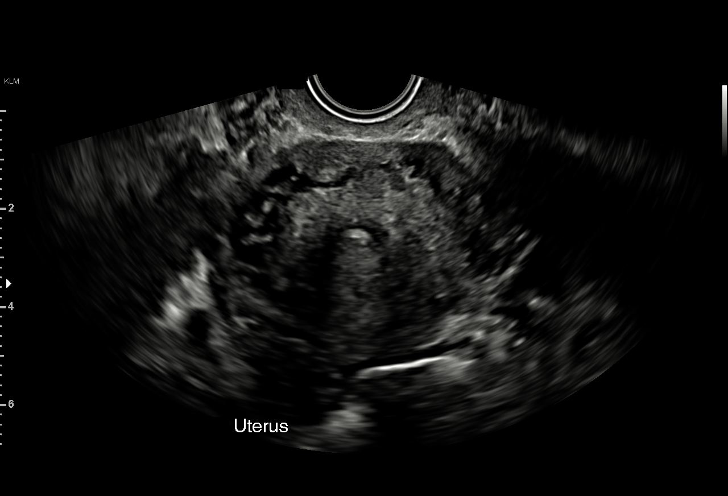
[im 10/34]
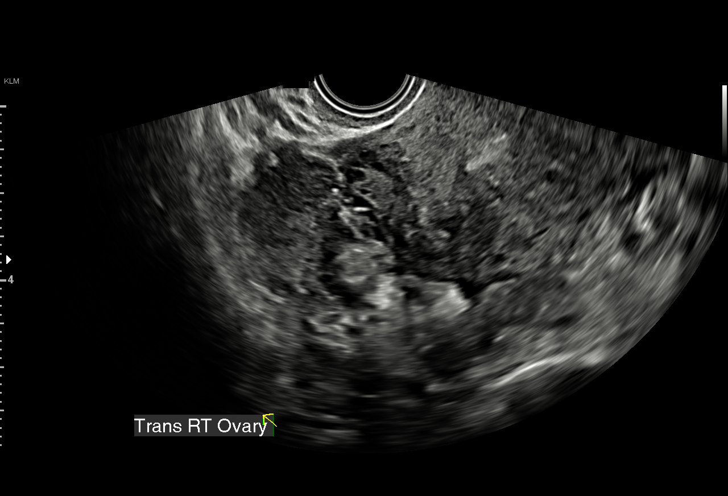
[im 13/34]
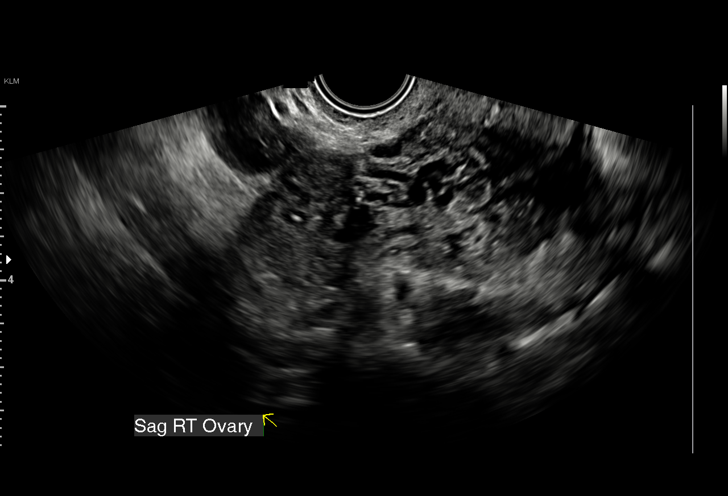
[im 15/34]
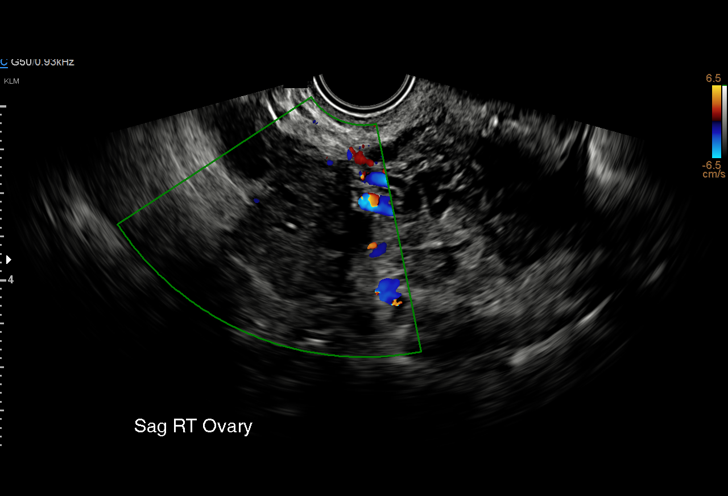
[im 18/34]
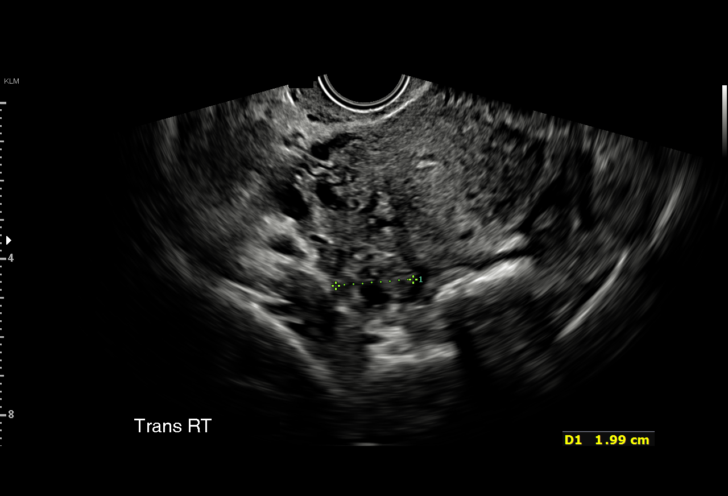
[im 19/34]
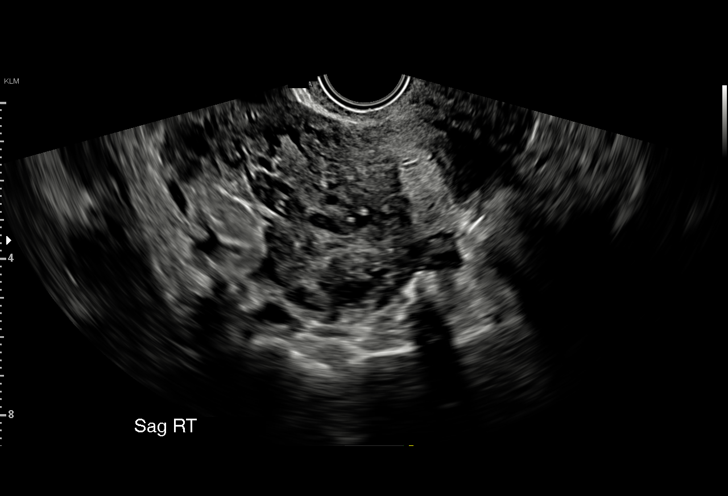
[im 21/34]
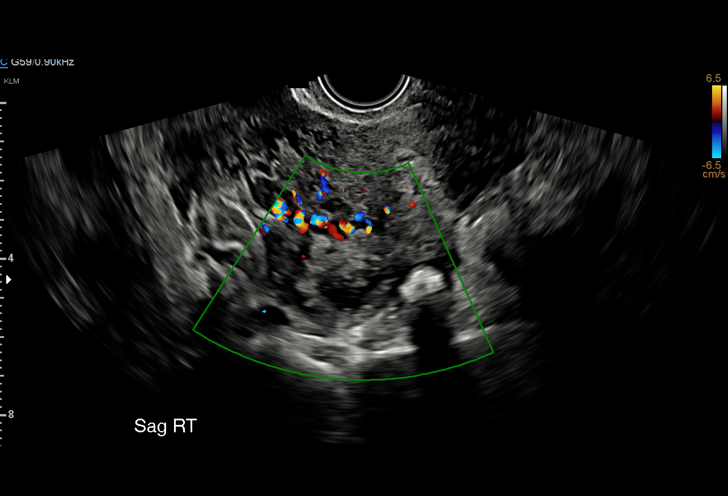
[im 24/34]
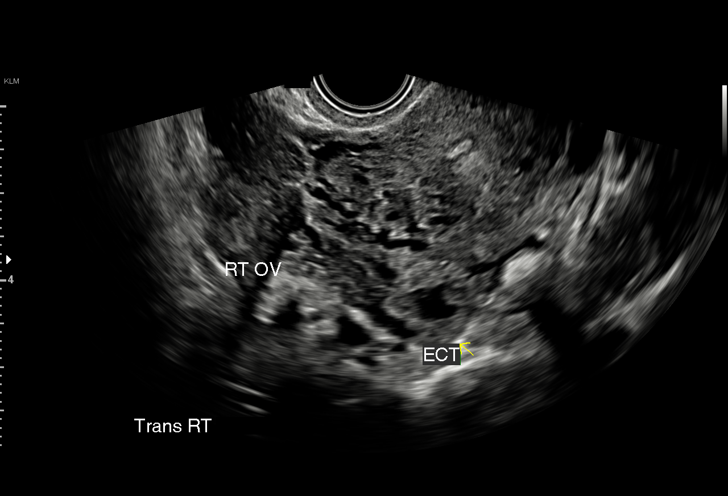
[im 26/34]
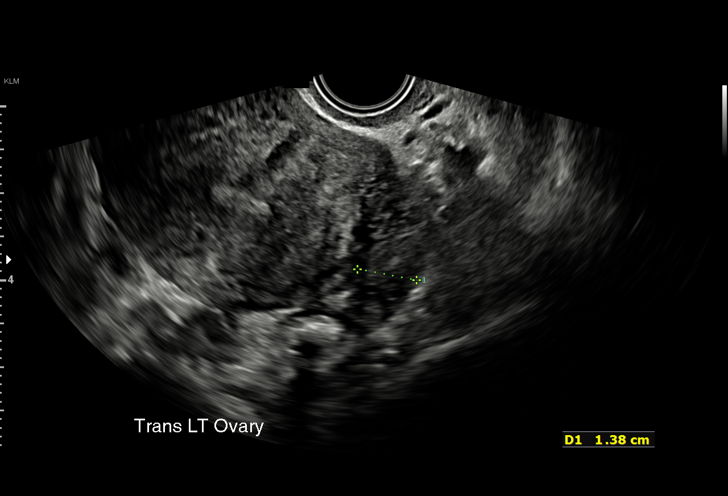
[im 29/34]
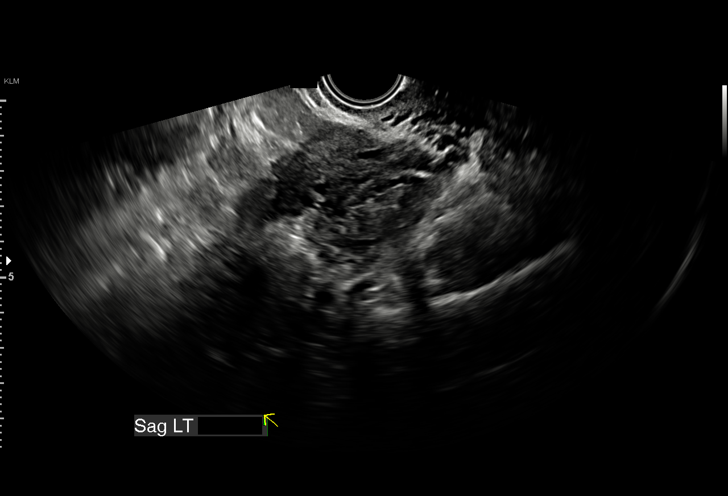
[im 31/34]
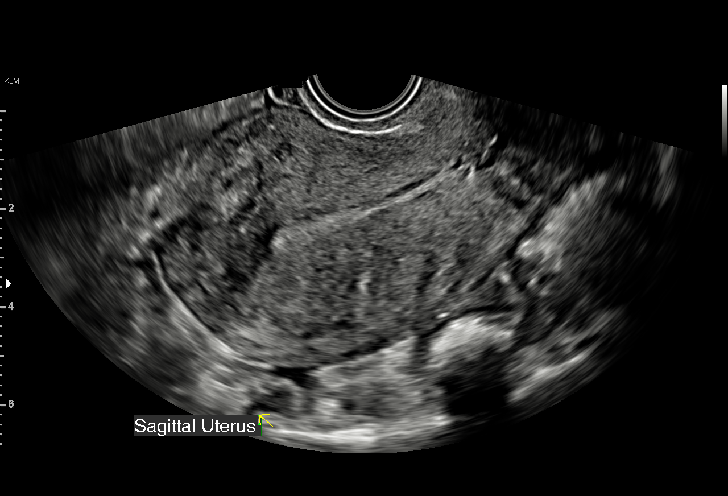
[im 34/34]
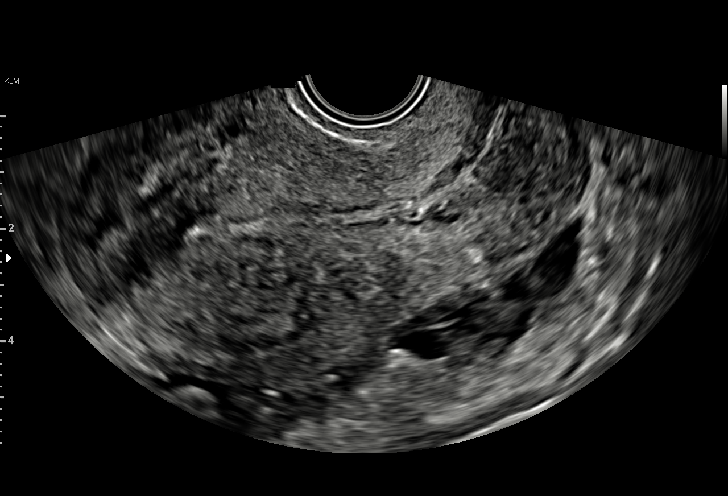

[15 of 28 positions shown; findings below may reference images not displayed]

FINDINGS: Intrauterine gestational sac: None

Yolk sac:  Not Visualized.

Embryo:  Not Visualized.

Cardiac Activity: Not Visualized.

Heart Rate: N/A bpm

Subchorionic hemorrhage:  None visualized.

Maternal uterus/adnexae:

The right ovary measures 3.3 cm x 2.3 cm x 2.3 cm. A 3.1 cm x 2.0 cm
x 2.0 cm heterogeneous echogenic mass is seen inferior and medial to
the right ovary. This area measures approximately 1.6 cm x 1.7 cm x
1.4 cm on the prior study.

The left ovary measures 3.1 cm x 1.8 cm x 1.4 cm and is normal in
appearance.

A trace amount of pelvic free fluid is noted.
IMPRESSION: Findings consistent with the patient's known right sided ectopic
pregnancy.

## 2023-11-15 ENCOUNTER — Ambulatory Visit: Payer: Self-pay

## 2023-11-15 NOTE — Telephone Encounter (Signed)
 Patient would like to be seen - directed to UC location near her home.  Reason for Disposition  Common cold with no complications  Answer Assessment - Initial Assessment Questions 1. ONSET: When did the nasal discharge start?      One week  2. AMOUNT: How much discharge is there?      Large  3. COUGH: Is there a cough? If so, ask, How bad is the cough?     Yes, also is a smoker  4. RESPIRATORY DISTRESS: Describe your child's breathing. What does it sound like? (eg wheezing, stridor, grunting, weak cry, unable to speak, retractions, rapid rate, cyanosis)     Denies  5. FEVER: Does your child have a fever? If so, ask: What is it, how was it measured, and when did it start?      99.6 highest  Protocols used: Common Cold-A-AH Copied from CRM #[id number]. Topic: Clinical - Red Word Triage >> Nov 15, 2023 11:36 AM Willma SAUNDERS wrote: Red Word that prompted transfer to Nurse Triage: Patient states she has a productive cough, stuffy nose, and a low grade fever. Had symptoms for about 1 week. Would like to establish at [facility]

## 2023-11-21 ENCOUNTER — Ambulatory Visit (HOSPITAL_BASED_OUTPATIENT_CLINIC_OR_DEPARTMENT_OTHER): Admitting: Student

## 2024-01-27 ENCOUNTER — Encounter (HOSPITAL_BASED_OUTPATIENT_CLINIC_OR_DEPARTMENT_OTHER): Payer: Self-pay

## 2024-01-27 ENCOUNTER — Ambulatory Visit (HOSPITAL_BASED_OUTPATIENT_CLINIC_OR_DEPARTMENT_OTHER)
Admission: RE | Admit: 2024-01-27 | Discharge: 2024-01-27 | Disposition: A | Discharge status: Home / Self Care | Source: Ambulatory Visit | Attending: Family Medicine | Admitting: Family Medicine

## 2024-01-27 ENCOUNTER — Other Ambulatory Visit (HOSPITAL_BASED_OUTPATIENT_CLINIC_OR_DEPARTMENT_OTHER): Payer: Self-pay

## 2024-01-27 VITALS — BP 122/77 | HR 83 | Temp 98.8°F | Resp 20

## 2024-01-27 DIAGNOSIS — Z5189 Encounter for other specified aftercare: Secondary | ICD-10-CM

## 2024-01-27 MED ORDER — SULFAMETHOXAZOLE-TRIMETHOPRIM 800-160 MG PO TABS
1.0000 | ORAL_TABLET | Freq: Two times a day (BID) | ORAL | 0 refills | Status: AC
  Filled 2024-01-27: qty 14, 7d supply, fill #0

## 2024-01-27 NOTE — Discharge Instructions (Signed)
 Antibiotics as prescribed.  Follow-up for any continued issues

## 2024-01-27 NOTE — ED Triage Notes (Signed)
 Patient states had a cyst removed today to RUQ of abdomen. Reports was cauterized closed. States painful to the area where incision made. Patient reports she had been trying to extract fluid from it before her procedure. Concerned she might need an antibiotic. Bandaid in place currently with no visible drainage. Patient states she had developed a low grade temp 99.8 earlier today and felt dizzy. VSS on arrival. Took 2 tylenol at 11am. Dressing removed. No drainage, skin pink.

## 2024-01-28 NOTE — ED Provider Notes (Signed)
 [name]    CSN: [id number] Arrival date & time: 01/27/24  1652      History   Chief Complaint Chief Complaint  Patient presents with   Wound Check    HPI Miranda Boyd is a 38 y.o. female.   Patient states had a cyst removed today to RUQ of abdomen. Reports was cauterized closed. States painful and warm see HPI to the area where incision made. Patient reports she had been trying to extract fluid from it before her procedure. Concerned she might need an antibiotic. Bandaid in place currently with no visible drainage. Patient states she had developed a low grade temp 99.8 earlier today and felt dizzy.Took 2 tylenol at 11am.    Wound Check    History reviewed. No pertinent past medical history.  Patient Active Problem List   Diagnosis Date Noted   Ectopic pregnancy of right ovary 05/12/2020    History reviewed. No pertinent surgical history.  OB History     Gravida  5   Para  2   Term  2   Preterm      AB  2   Living         SAB  1   IAB  1   Ectopic      Multiple      Live Births               Home Medications    Prior to Admission medications   Medication Sig Start Date End Date Taking? Authorizing Provider  sulfamethoxazole-trimethoprim (BACTRIM DS) 800-160 MG tablet Take 1 tablet by mouth 2 (two) times daily for 7 days. 01/27/24 02/03/24 Yes Jaelynn Currier A, FNP  acyclovir (ZOVIRAX) 800 MG tablet Take 400 mg by mouth 2 (two) times daily.    [provider]    Family History History reviewed. No pertinent family history.  Social History Social History   Tobacco Use   Smoking status: Every Day    Current packs/day: 0.50    Types: Cigarettes   Smokeless tobacco: Former  Building Services Engineer status: Every Day   Substances: Nicotine  Substance Use Topics   Alcohol use: Not Currently   Drug use: Never     Allergies   Keflex [cephalexin]   Review of Systems Review of Systems See HPI  Physical  Exam Triage Vital Signs ED Triage Vitals  Encounter Vitals Group     BP 01/27/24 1719 122/77     Girls Systolic BP Percentile --      Girls Diastolic BP Percentile --      Boys Systolic BP Percentile --      Boys Diastolic BP Percentile --      Pulse Rate 01/27/24 1719 83     Resp 01/27/24 1719 20     Temp 01/27/24 1719 98.8 F (37.1 C)     Temp Source 01/27/24 1719 Oral     SpO2 01/27/24 1719 98 %     Weight --      Height --      Head Circumference --      Peak Flow --      Pain Score 01/27/24 1720 8     Pain Loc --      Pain Education --      Exclude from Growth Chart --    No data found.  Updated Vital Signs BP 122/77 (BP Location: Right Arm)   Pulse 83   Temp 98.8 F (37.1 C) (Oral)  Resp 20   LMP 01/02/2024   SpO2 98%   Visual Acuity Right Eye Distance:   Left Eye Distance:   Bilateral Distance:    Right Eye Near:   Left Eye Near:    Bilateral Near:     Physical Exam Vitals and nursing note reviewed.  Constitutional:      General: She is not in acute distress.    Appearance: Normal appearance. She is not ill-appearing, toxic-appearing or diaphoretic.  Pulmonary:     Effort: Pulmonary effort is normal.  Skin:    General: Skin is warm and dry.     Comments: Tiny incision noted to right upper quadrant area with surrounding erythema, warm and indurated area No drainage  Neurological:     Mental Status: She is alert.  Psychiatric:        Mood and Affect: Mood normal.      UC Treatments / Results  Labs (all labs ordered are listed, but only abnormal results are displayed) Labs Reviewed - No data to display  EKG   Radiology No results found.  Procedures Procedures (including critical care time)  Medications Ordered in UC Medications - No data to display  Initial Impression / Assessment and Plan / UC Course  I have reviewed the triage vital signs and the nursing notes.  Pertinent labs & imaging results that were available during my  care of the patient were reviewed by me and considered in my medical decision making (see chart for details).     Visit for wound check-the area around the incision where the cyst removed is red, indurated and warm to touch.  Concern for underlying infection.  Will go ahead and treat with antibiotics at this time.  Recommend continue to monitor the area and follow-up for any continued or worsening issues Final Clinical Impressions(s) / UC Diagnoses   Final diagnoses:  Visit for wound check     Discharge Instructions      Antibiotics as prescribed.  Follow-up for any continued issues   ED Prescriptions     Medication Sig Dispense Auth. Provider   sulfamethoxazole-trimethoprim (BACTRIM DS) 800-160 MG tablet Take 1 tablet by mouth 2 (two) times daily for 7 days. 14 tablet Adah Wilbert LABOR, FNP      PDMP not reviewed this encounter.   Adah Wilbert LABOR, FNP 01/28/24 [phone]

## 2024-02-08 ENCOUNTER — Encounter (HOSPITAL_BASED_OUTPATIENT_CLINIC_OR_DEPARTMENT_OTHER): Payer: Self-pay

## 2024-02-08 ENCOUNTER — Inpatient Hospital Stay (HOSPITAL_BASED_OUTPATIENT_CLINIC_OR_DEPARTMENT_OTHER): Admission: RE | Admit: 2024-02-08 | Source: Ambulatory Visit

## 2024-02-08 ENCOUNTER — Inpatient Hospital Stay (INDEPENDENT_AMBULATORY_CARE_PROVIDER_SITE_OTHER): Admission: RE | Admit: 2024-02-08 | Source: Ambulatory Visit

## 2024-02-08 ENCOUNTER — Ambulatory Visit (HOSPITAL_BASED_OUTPATIENT_CLINIC_OR_DEPARTMENT_OTHER)
Admission: EM | Admit: 2024-02-08 | Discharge: 2024-02-08 | Disposition: A | Discharge status: Home / Self Care | Attending: Family Medicine | Admitting: Family Medicine

## 2024-02-08 DIAGNOSIS — M940 Chondrocostal junction syndrome [Tietze]: Secondary | ICD-10-CM

## 2024-02-08 DIAGNOSIS — J0140 Acute pansinusitis, unspecified: Secondary | ICD-10-CM | POA: Diagnosis not present

## 2024-02-08 DIAGNOSIS — R051 Acute cough: Secondary | ICD-10-CM

## 2024-02-08 MED ORDER — CEFDINIR 300 MG PO CAPS: 300.0000 mg | ORAL_CAPSULE | Freq: Two times a day (BID) | ORAL | 0 refills | Status: AC

## 2024-02-08 MED ORDER — PROMETHAZINE-DM 6.25-15 MG/5ML PO SYRP: 5.0000 mL | ORAL_SOLUTION | Freq: Four times a day (QID) | ORAL | 0 refills | Status: DC | PRN

## 2024-02-08 MED ORDER — PREDNISONE 20 MG PO TABS: 20.0000 mg | ORAL_TABLET | Freq: Every day | ORAL | 0 refills | Status: AC

## 2024-02-08 NOTE — ED Triage Notes (Signed)
 Onset 2 weeks ago of chest congestion, headache, body aches, cough, and ear pain. States upper back hurts from coughing. No nasal congestion except for at night when attempting to sleep. Patient has been using night time cold and flu for symptoms.

## 2024-02-08 NOTE — ED Triage Notes (Signed)
 Onset 2 weeks ago of chest congestion, headache, body aches, cough, and ear pain. States upper back hurts from coughing. Patient is a smoker and trying to cut back. Patient has been using night time cold and flu for symptoms.

## 2024-02-08 NOTE — Discharge Instructions (Addendum)
 Sinusitis with cough and costochondritis: Cefdinir, 300 mg, 1 pill twice daily for 10 days.  Prednisone, 20 mg 1 pill daily for 5 days.  Promethazine DM, 5 mL, every 6 hours if needed for cough.  Get plenty of fluids and rest.  Encouraged sinus rinses with the bottle system twice daily.  Follow-up if symptoms do not improve, worsen or new symptoms occur.

## 2024-02-08 NOTE — ED Provider Notes (Addendum)
 [name]    CSN: [id number] Arrival date & time: 02/08/24  1836      History   Chief Complaint Chief Complaint  Patient presents with   Cough   headache   chest congestion   Sore Throat    HPI Miranda Boyd is a 38 y.o. female.   38 year old female with report of headache, body aches, cough, ear pain, nasal congestion, sinus pressure and chest congestion.  Symptoms started on approximately 01/15/2024 or earlier.  She has got some lower rib pain anteriorly that she thinks is from coughing a lot.  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  Her mother and father are being treated for bronchitis or sinusitis with antibiotics and steroids and she thinks she got it from them.  She used cold and flu medicines without any help with her symptoms.   Cough Associated symptoms: chest pain (Anterior chest wall pain) and rhinorrhea   Associated symptoms: no chills, no ear pain, no fever, no rash, no shortness of breath and no sore throat   Sore Throat Associated symptoms include chest pain (Anterior chest wall pain). Pertinent negatives include no abdominal pain and no shortness of breath.    History reviewed. No pertinent past medical history.  Patient Active Problem List   Diagnosis Date Noted   Ectopic pregnancy of right ovary 05/12/2020    History reviewed. No pertinent surgical history.  OB History     Gravida  5   Para  2   Term  2   Preterm      AB  2   Living         SAB  1   IAB  1   Ectopic      Multiple      Live Births               Home Medications    Prior to Admission medications   Medication Sig Start Date End Date Taking? Authorizing Provider  cefdinir (OMNICEF) 300 MG capsule Take 1 capsule (300 mg total) by mouth 2 (two) times daily for 10 days. 02/08/24 02/18/24 Yes Ival Domino, FNP  predniSONE (DELTASONE) 20 MG tablet  Take 1 tablet (20 mg total) by mouth daily with breakfast for 5 days. 02/08/24 02/13/24 Yes Ival Domino, FNP  promethazine-dextromethorphan (PROMETHAZINE-DM) 6.25-15 MG/5ML syrup Take 5 mLs by mouth 4 (four) times daily as needed for cough. Do not use and drive - May make drowsy. 02/08/24  Yes Ival Domino, FNP  acyclovir (ZOVIRAX) 800 MG tablet Take 400 mg by mouth 2 (two) times daily.    [provider]    Family History History reviewed. No pertinent family history.  Social History Social History   Tobacco Use   Smoking status: Every Day    Current packs/day: 0.50    Types: Cigarettes   Smokeless tobacco: Former  Building Services Engineer status: Every Day   Substances: Nicotine  Substance Use Topics   Alcohol use: Not Currently   Drug use: Never     Allergies   Keflex [cephalexin]   Review of Systems Review of Systems  Constitutional:  Negative for chills and fever.  HENT:  Positive for congestion, postnasal drip, rhinorrhea, sinus pressure and sinus pain. Negative for ear pain and sore throat.   Eyes:  Negative for pain and visual  disturbance.  Respiratory:  Positive for cough. Negative for shortness of breath.   Cardiovascular:  Positive for chest pain (Anterior chest wall pain). Negative for palpitations.  Gastrointestinal:  Negative for abdominal pain, constipation, diarrhea, nausea and vomiting.  Genitourinary:  Negative for dysuria and hematuria.  Musculoskeletal:  Negative for arthralgias and back pain.  Skin:  Negative for color change and rash.  Neurological:  Negative for seizures and syncope.  All other systems reviewed and are negative.    Physical Exam Triage Vital Signs ED Triage Vitals  Encounter Vitals Group     BP 02/08/24 1845 114/78     Girls Systolic BP Percentile --      Girls Diastolic BP Percentile --      Boys Systolic BP Percentile --      Boys Diastolic BP Percentile --      Pulse Rate 02/08/24 1845 78     Resp 02/08/24 1845 20      Temp 02/08/24 1845 98.3 F (36.8 C)     Temp Source 02/08/24 1845 Oral     SpO2 02/08/24 1845 98 %     Weight --      Height --      Head Circumference --      Peak Flow --      Pain Score 02/08/24 1846 5     Pain Loc --      Pain Education --      Exclude from Growth Chart --    No data found.  Updated Vital Signs BP 114/78 (BP Location: Right Arm)   Pulse 78   Temp 98.3 F (36.8 C) (Oral)   Resp 20   LMP 01/30/2024   SpO2 98%   Visual Acuity Right Eye Distance:   Left Eye Distance:   Bilateral Distance:    Right Eye Near:   Left Eye Near:    Bilateral Near:     Physical Exam Vitals and nursing note reviewed.  Constitutional:      General: She is not in acute distress.    Appearance: She is well-developed. She is not ill-appearing, toxic-appearing or diaphoretic.  HENT:     Head: Normocephalic and atraumatic.     Right Ear: Hearing, tympanic membrane, ear canal and external ear normal.     Left Ear: Hearing, tympanic membrane, ear canal and external ear normal.     Nose: Mucosal edema, congestion and rhinorrhea present. Rhinorrhea is purulent.     Right Sinus: Maxillary sinus tenderness and frontal sinus tenderness present.     Left Sinus: Maxillary sinus tenderness and frontal sinus tenderness present.     Mouth/Throat:     Lips: Pink.     Mouth: Mucous membranes are moist.     Pharynx: Uvula midline. No oropharyngeal exudate or posterior oropharyngeal erythema.     Tonsils: No tonsillar exudate.  Eyes:     Conjunctiva/sclera: Conjunctivae normal.     Pupils: Pupils are equal, round, and reactive to light.  Cardiovascular:     Rate and Rhythm: Normal rate and regular rhythm.     Heart sounds: S1 normal and S2 normal. No murmur heard. Pulmonary:     Effort: Pulmonary effort is normal. No respiratory distress.     Breath sounds: Normal breath sounds. No decreased breath sounds, wheezing, rhonchi or rales.  Chest:     Chest wall: Tenderness (Lower  anterior ribs bilaterally with mild tenderness on palpation.) present. No mass, lacerations, deformity, swelling, crepitus or edema. There is  no dullness to percussion.  Abdominal:     General: Bowel sounds are normal.     Palpations: Abdomen is soft.     Tenderness: There is no abdominal tenderness.  Musculoskeletal:        General: No swelling.     Cervical back: Neck supple.  Lymphadenopathy:     Head:     Right side of head: No submental, submandibular, tonsillar, preauricular or posterior auricular adenopathy.     Left side of head: No submental, submandibular, tonsillar, preauricular or posterior auricular adenopathy.     Cervical: Cervical adenopathy present.     Right cervical: Superficial cervical adenopathy present.     Left cervical: Superficial cervical adenopathy present.  Skin:    General: Skin is warm and dry.     Capillary Refill: Capillary refill takes less than 2 seconds.     Findings: No rash.  Neurological:     Mental Status: She is alert and oriented to person, place, and time.  Psychiatric:        Mood and Affect: Mood normal.      UC Treatments / Results  Labs (all labs ordered are listed, but only abnormal results are displayed) Labs Reviewed - No data to display  EKG   Radiology No results found.  Procedures Procedures (including critical care time)  Medications Ordered in UC Medications - No data to display  Initial Impression / Assessment and Plan / UC Course  I have reviewed the triage vital signs and the nursing notes.  Pertinent labs & imaging results that were available during my care of the patient were reviewed by me and considered in my medical decision making (see chart for details).  Plan of Care: Sinusitis with cough and costochondritis: Cefdinir, 300 mg, 1 pill twice daily for 10 days.  Prednisone, 20 mg 1 pill daily for 5 days.  Promethazine DM, 5 mL, every 6 hours if needed for cough.  Get plenty of fluids and rest.  Encouraged  sinus rinses with the bottle system twice daily.  Follow-up if symptoms do not improve, worsen or new symptoms occur.  I reviewed the plan of care with the patient and/or the patient's guardian.  The patient and/or guardian had time to ask questions and acknowledged that the questions were answered.  I provided instruction on symptoms or reasons to return here or to go to an ER, if symptoms/condition did not improve, worsened or if new symptoms occurred.  Final Clinical Impressions(s) / UC Diagnoses   Final diagnoses:  Acute non-recurrent pansinusitis  Costochondritis  Acute cough     Discharge Instructions      Sinusitis with cough and costochondritis: Cefdinir, 300 mg, 1 pill twice daily for 10 days.  Prednisone, 20 mg 1 pill daily for 5 days.  Promethazine DM, 5 mL, every 6 hours if needed for cough.  Get plenty of fluids and rest.  Encouraged sinus rinses with the bottle system twice daily.  Follow-up if symptoms do not improve, worsen or new symptoms occur.     ED Prescriptions     Medication Sig Dispense Auth. Provider   cefdinir (OMNICEF) 300 MG capsule Take 1 capsule (300 mg total) by mouth 2 (two) times daily for 10 days. 20 capsule Ival Domino, FNP   promethazine-dextromethorphan (PROMETHAZINE-DM) 6.25-15 MG/5ML syrup Take 5 mLs by mouth 4 (four) times daily as needed for cough. Do not use and drive - May make drowsy. 118 mL Ival Domino, FNP   predniSONE (DELTASONE) 20  MG tablet Take 1 tablet (20 mg total) by mouth daily with breakfast for 5 days. 5 tablet Leone Putman, FNP      PDMP not reviewed this encounter.   Ival Domino, FNP 02/08/24 1918    Ival Domino, FNP 02/08/24 2041

## 2024-02-28 ENCOUNTER — Ambulatory Visit (HOSPITAL_BASED_OUTPATIENT_CLINIC_OR_DEPARTMENT_OTHER)
Admission: RE | Admit: 2024-02-28 | Discharge: 2024-02-28 | Disposition: A | Discharge status: Home / Self Care | Source: Ambulatory Visit | Attending: Family Medicine | Admitting: Family Medicine

## 2024-02-28 ENCOUNTER — Ambulatory Visit (HOSPITAL_BASED_OUTPATIENT_CLINIC_OR_DEPARTMENT_OTHER)
Admission: RE | Admit: 2024-02-28 | Discharge: 2024-02-28 | Disposition: A | Discharge status: Home / Self Care | Source: Ambulatory Visit

## 2024-02-28 ENCOUNTER — Encounter (HOSPITAL_BASED_OUTPATIENT_CLINIC_OR_DEPARTMENT_OTHER): Payer: Self-pay | Admitting: Emergency Medicine

## 2024-02-28 DIAGNOSIS — L259 Unspecified contact dermatitis, unspecified cause: Secondary | ICD-10-CM

## 2024-02-28 DIAGNOSIS — L282 Other prurigo: Secondary | ICD-10-CM | POA: Diagnosis not present

## 2024-02-28 MED ORDER — METHYLPREDNISOLONE ACETATE 80 MG/ML IJ SUSP
80.0000 mg | Freq: Once | INTRAMUSCULAR | Status: AC
  Administered 2024-02-28: 80 mg via INTRAMUSCULAR

## 2024-02-28 NOTE — ED Provider Notes (Signed)
 [facility]    CSN: [id number] Arrival date & time: 02/28/24  1601      History   Chief Complaint Chief Complaint  Patient presents with   Rash    HPI Miranda Boyd is a 38 y.o. female.   38 year old female with report of cleaning her yard and pulling a lot of vines and she now has a rash all over.  She was working over the weekend (02/25/2024) and the rash occurred on 02/26/2024.  She has tried Claritin, Benadryl, calamine lotion and using rubbing alcohol on the rash.  She is itching all over and feels like she needs steroids.  She does better with the shots.  Steroid pills make her heart, race.   Rash Associated symptoms: no abdominal pain, no diarrhea, no fever, no joint pain, no nausea and not vomiting     History reviewed. No pertinent past medical history.  Patient Active Problem List   Diagnosis Date Noted   Ectopic pregnancy of right ovary 05/12/2020    History reviewed. No pertinent surgical history.  OB History     Gravida  5   Para  2   Term  2   Preterm      AB  2   Living         SAB  1   IAB  1   Ectopic      Multiple      Live Births               Home Medications    Prior to Admission medications   Medication Sig Start Date End Date Taking? Authorizing Provider  acyclovir (ZOVIRAX) 800 MG tablet Take 400 mg by mouth 2 (two) times daily.    [provider]  promethazine -dextromethorphan (PROMETHAZINE -DM) 6.25-15 MG/5ML syrup Take 5 mLs by mouth 4 (four) times daily as needed for cough. Do not use and drive - May make drowsy. 02/08/24   Ival Domino, FNP    Family History History reviewed. No pertinent family history.  Social History Social History   Tobacco Use   Smoking status: Every Day    Current packs/day: 0.50    Types: Cigarettes   Smokeless tobacco: Former  Building Services Engineer status: Every Day   Substances: Nicotine  Substance Use Topics   Alcohol use: Not Currently   Drug use: Never      Allergies   Keflex [cephalexin]   Review of Systems Review of Systems  Constitutional:  Negative for fever.  Respiratory:  Negative for cough.   Cardiovascular:  Negative for chest pain.  Gastrointestinal:  Negative for abdominal pain, constipation, diarrhea, nausea and vomiting.  Musculoskeletal:  Negative for arthralgias and back pain.  Skin:  Positive for rash. Negative for color change.  Neurological:  Negative for syncope.  All other systems reviewed and are negative.    Physical Exam Triage Vital Signs ED Triage Vitals  Encounter Vitals Group     BP 02/28/24 1656 127/74     Girls Systolic BP Percentile --      Girls Diastolic BP Percentile --      Boys Systolic BP Percentile --      Boys Diastolic BP Percentile --      Pulse Rate 02/28/24 1656 75     Resp 02/28/24 1656 18     Temp 02/28/24 1656 98.2 F (36.8 C)     Temp Source 02/28/24 1656 Oral     SpO2 02/28/24 1656 98 %  Weight --      Height --      Head Circumference --      Peak Flow --      Pain Score 02/28/24 1655 0     Pain Loc --      Pain Education --      Exclude from Growth Chart --    No data found.  Updated Vital Signs BP 127/74 (BP Location: Right Arm)   Pulse 75   Temp 98.2 F (36.8 C) (Oral)   Resp 18   LMP 02/24/2024   SpO2 98%   Visual Acuity Right Eye Distance:   Left Eye Distance:   Bilateral Distance:    Right Eye Near:   Left Eye Near:    Bilateral Near:     Physical Exam Vitals and nursing note reviewed.  Constitutional:      General: She is not in acute distress.    Appearance: She is well-developed. She is not ill-appearing or toxic-appearing.  HENT:     Head: Normocephalic and atraumatic.     Right Ear: External ear normal.     Left Ear: External ear normal.     Nose: Nose normal.     Mouth/Throat:     Lips: Pink.     Mouth: Mucous membranes are moist.  Eyes:     Conjunctiva/sclera: Conjunctivae normal.     Pupils: Pupils are equal, round, and  reactive to light.  Cardiovascular:     Rate and Rhythm: Normal rate and regular rhythm.     Heart sounds: S1 normal and S2 normal. No murmur heard. Pulmonary:     Effort: Pulmonary effort is normal. No respiratory distress.     Breath sounds: Normal breath sounds. No decreased breath sounds, wheezing, rhonchi or rales.  Musculoskeletal:        General: No swelling.  Skin:    General: Skin is warm and dry.     Capillary Refill: Capillary refill takes less than 2 seconds.     Findings: Rash (Urticarial wheals on her face: Forehead, cheeks.  She also has some small papules on the dorsum of her right hand.) present.  Neurological:     Mental Status: She is alert and oriented to person, place, and time.  Psychiatric:        Mood and Affect: Mood normal.           UC Treatments / Results  Labs (all labs ordered are listed, but only abnormal results are displayed) Labs Reviewed - No data to display  EKG   Radiology No results found.  Procedures Procedures (including critical care time)  Medications Ordered in UC Medications  methylPREDNISolone  acetate (DEPO-MEDROL ) injection 80 mg (80 mg Intramuscular Given 02/28/24 1724)    Initial Impression / Assessment and Plan / UC Course  I have reviewed the triage vital signs and the nursing notes.  Pertinent labs & imaging results that were available during my care of the patient were reviewed by me and considered in my medical decision making (see chart for details).  Plan of Care: Contact dermatitis with itching rash: Use Claritin 10 mg daily in the morning.  May take Claritin 10 mg nightly or Benadryl 25 mg nightly.  Depo-Medrol  80 mg injection now.  Depo-Medrol  is a steroid injection.  Use acetaminophen if needed for headache or pain but avoid ibuprofen for 1 to 2 weeks due to use of the Depo-Medrol .  If symptoms do not completely resolve in 4 to 10 days,  follow-up for possible oral prednisone .  Return sooner if needed.  I  reviewed the plan of care with the patient and/or the patient's guardian.  The patient and/or guardian had time to ask questions and acknowledged that the questions were answered.  I provided instruction on symptoms or reasons to return here or to go to an ER, if symptoms/condition did not improve, worsened or if new symptoms occurred.  Final Clinical Impressions(s) / UC Diagnoses   Final diagnoses:  Pruritic rash  Contact dermatitis, unspecified contact dermatitis type, unspecified trigger     Discharge Instructions      Contact dermatitis with itching rash: Use Claritin 10 mg daily in the morning.  May take Claritin 10 mg nightly or Benadryl 25 mg nightly.  Depo-Medrol  80 mg injection now.  Depo-Medrol  is a steroid injection.  Use acetaminophen if needed for headache or pain but avoid ibuprofen for 1 to 2 weeks due to use of the Depo-Medrol .  If symptoms do not completely resolve in 4 to 10 days, follow-up for possible oral prednisone .  Return sooner if needed.     ED Prescriptions   None    PDMP not reviewed this encounter.   Ival Domino, FNP 02/28/24 [id number]

## 2024-02-28 NOTE — Discharge Instructions (Addendum)
 Contact dermatitis with itching rash: Use Claritin 10 mg daily in the morning.  May take Claritin 10 mg nightly or Benadryl 25 mg nightly.  Depo-Medrol  80 mg injection now.  Depo-Medrol  is a steroid injection.  Use acetaminophen if needed for headache or pain but avoid ibuprofen for 1 to 2 weeks due to use of the Depo-Medrol .  If symptoms do not completely resolve in 4 to 10 days, follow-up for possible oral prednisone .  Return sooner if needed.

## 2024-02-28 NOTE — ED Triage Notes (Signed)
 Pt reports rash and itching all over her body. Pt reports she was outside puling a vine up she thinks its either poison or sumac. Pt requests a steroid shot

## 2024-02-29 ENCOUNTER — Ambulatory Visit: Payer: Self-pay

## 2024-02-29 NOTE — Telephone Encounter (Signed)
 FYI Only or Action Required?: FYI only for provider: appointment scheduled on 12/3 as a new patient.  Called Nurse Triage reporting Poison Ivy.  Symptoms began several days ago.  Interventions attempted: Other: was seen in urgent care and given a steroid shot.  Symptoms are: unchanged.  Triage Disposition: Home Care  Patient/caregiver understands and will follow disposition?: Yes     Copied from CRM #[id number]. Topic: Clinical - Red Word Triage >> Feb 29, 2024 11:45 AM Gustabo D wrote: Pt has a rash thinks it poison ivy or some other things for about 3 days. Was given a steroid shot and it didn't help.      Reason for Disposition  Mild rash from poison ivy, oak or sumac  Answer Assessment - Initial Assessment Questions 1. APPEARANCE of RASH: What does the rash look like?      Diffuse red rash 2. LOCATION: Where is the rash located?  (e.g., face, genitals, hands, legs)     Face and hands  3. SIZE: How large is the rash?      Diffuse  4. ONSET: When did the rash begin?      3 days ago 5. ITCHING: Does the rash itch? If Yes, ask: How bad is it?     Moderate to severe  6. EXPOSURE:  How were you exposed to the plant (poison ivy, poison oak, sumac)  When were you exposed?  Note: Sometimes a poison ivy/oak/sumac rash does not appear for 2 to 3 weeks after exposure.      Unsure  8. OTHER SYMPTOMS: Do you have any other symptoms? (e.g., fever)      No  Protocols used: Poison Ivy - Oak - Sumac-A-AH

## 2024-03-07 ENCOUNTER — Encounter (HOSPITAL_BASED_OUTPATIENT_CLINIC_OR_DEPARTMENT_OTHER): Payer: Self-pay | Admitting: Student

## 2024-03-07 ENCOUNTER — Ambulatory Visit (INDEPENDENT_AMBULATORY_CARE_PROVIDER_SITE_OTHER): Admitting: Student

## 2024-03-07 ENCOUNTER — Other Ambulatory Visit (HOSPITAL_BASED_OUTPATIENT_CLINIC_OR_DEPARTMENT_OTHER): Payer: Self-pay

## 2024-03-07 VITALS — BP 138/74 | HR 83 | Temp 98.1°F | Resp 16 | Ht 63.19 in | Wt 139.7 lb

## 2024-03-07 DIAGNOSIS — Z7689 Persons encountering health services in other specified circumstances: Secondary | ICD-10-CM

## 2024-03-07 DIAGNOSIS — M419 Scoliosis, unspecified: Secondary | ICD-10-CM

## 2024-03-07 DIAGNOSIS — F17200 Nicotine dependence, unspecified, uncomplicated: Secondary | ICD-10-CM | POA: Diagnosis not present

## 2024-03-07 DIAGNOSIS — F411 Generalized anxiety disorder: Secondary | ICD-10-CM | POA: Diagnosis not present

## 2024-03-07 DIAGNOSIS — B009 Herpesviral infection, unspecified: Secondary | ICD-10-CM | POA: Insufficient documentation

## 2024-03-07 MED ORDER — VALACYCLOVIR HCL 500 MG PO TABS
500.0000 mg | ORAL_TABLET | Freq: Every day | ORAL | 1 refills | Status: DC
  Filled 2024-03-07: qty 30, 30d supply, fill #0

## 2024-03-07 MED ORDER — FLUOXETINE HCL 10 MG PO CAPS
10.0000 mg | ORAL_CAPSULE | Freq: Every day | ORAL | 1 refills | Status: DC
  Filled 2024-03-07: qty 30, 30d supply, fill #0

## 2024-03-07 MED ORDER — VARENICLINE TARTRATE (STARTER) 0.5 MG X 11 & 1 MG X 42 PO TBPK
ORAL_TABLET | ORAL | 0 refills | Status: AC
  Filled 2024-03-07: qty 53, 28d supply, fill #0

## 2024-03-07 NOTE — Progress Notes (Signed)
 New Patient Office Visit  Subjective    Patient ID: Miranda Boyd, female    DOB: 26-Dec-1985  Age: 38 y.o. MRN: [id number]  CC:  Chief Complaint  Patient presents with   Establish Care    Here to establish care.    Stress    Discuss anxiety & depression. Was prescribed something at [facility], unsure of what it was but it did not work.  Believes it was hydroxyzine. Son is on prozac  & mom takes xanax.    herpes simplex    Has a lesion on nose due to stress. Would like an rx. Would like to know if there is a once a day pill she could take?    Discussed the use of AI scribe software for clinical note transcription with the patient, who gave verbal consent to proceed.  History of Present Illness   Miranda Boyd is a 38 year old female who presents to establish care and discuss a lesion on her nose.  She has a lesion on her nose, described as not fully developed but with a tingling sensation at the tip and surface area; she feels an outbreak is coming. The lesion is located at the corner of her nose. She has a history of herpes confirmed by a swab test and has been using acyclovir cream to dry it up before it fully develops.  She experiences anxiety, with difficulty breathing, with her heart pounding. These symptoms have been ongoing for more than six months and are exacerbated by stress, such as the recent passing of a friend. She experiences symptoms suggestive of panic attacks, including shaky hands, rapid heart rate, and tingling in the fingertips. There is a family history of anxiety, with her son being on Prozac  and her mother taking Xanax. She has previously been prescribed hydroxyzine for anxiety but found it ineffective.  She smokes about half a pack of cigarettes a day, with a history of smoking up to a pack a day when stressed. She wants to quit smoking and is open to trying medication to assist with cessation.  Her family history includes cancer, with her mother having had stage  four cancer affecting the lungs and breasts. She has a history of HPV and has not had a Pap smear in over five years, although her last Pap smear was normal.  She reports lower back pain, which she attributes to smoking and coughing, and has a history of slight scoliosis.      Screenings:  Colon Cancer: no [facility] Lung Cancer: Smoke 0.5 ppd of cigarettes.  Breast Cancer: [facility]? Cervical Cancer: history of HPV.  Diabetes: indicated HLD: indicated  Outpatient Encounter Medications as of 03/07/2024  Medication Sig   FLUoxetine  (PROZAC ) 10 MG capsule Take 1 capsule (10 mg total) by mouth daily.   valACYclovir  (VALTREX ) 500 MG tablet Take 1 tablet (500 mg total) by mouth daily.   Varenicline  Tartrate, Starter, (CHANTIX  STARTING MONTH PAK) 0.5 MG X 11 & 1 MG X 42 TBPK Take by mouth as directed.   [DISCONTINUED] acyclovir (ZOVIRAX) 800 MG tablet Take 400 mg by mouth 2 (two) times daily. (Patient not taking: Reported on 03/07/2024)   [DISCONTINUED] promethazine -dextromethorphan (PROMETHAZINE -DM) 6.25-15 MG/5ML syrup Take 5 mLs by mouth 4 (four) times daily as needed for cough. Do not use and drive - May make drowsy.   No facility-administered encounter medications on file as of 03/07/2024.    Past Medical History:  Diagnosis Date   Anxiety  Depression     Past Surgical History:  Procedure Laterality Date   COLPOSCOPY     due to HPV    Family History  Problem Relation Age of Onset   Anxiety disorder Mother    Healthy Father    Asthma Sister    Anxiety disorder Brother    Allergies Brother     Social History   Socioeconomic History   Marital status: Single    Spouse name: Not on file   Number of children: 2   Years of education: Not on file   Highest education level: Not on file  Occupational History   Not on file  Tobacco Use   Smoking status: Every Day    Current packs/day: 0.50    Average packs/day: 0.5 packs/day for 20.9 years (10.5 ttl pk-yrs)    Types: Cigarettes     Start date: 2005    Passive exposure: Current   Smokeless tobacco: Never  Vaping Use   Vaping status: Some Days   Substances: Nicotine  Substance and Sexual Activity   Alcohol use: Not Currently   Drug use: Never   Sexual activity: Not Currently  Other Topics Concern   Not on file  Social History Narrative   Not on file   Social Drivers of Health   Financial Resource Strain: Low Risk  (05/16/2023)   Received from [facility]   Overall Financial Resource Strain (CARDIA)    Difficulty of Paying Living Expenses: Not hard at all  Food Insecurity: No Food Insecurity (03/07/2024)   Hunger Vital Sign    Worried About Running Out of Food in the Last Year: Never true    Ran Out of Food in the Last Year: Never true  Transportation Needs: No Transportation Needs (03/07/2024)   PRAPARE - Administrator, Civil Service (Medical): No    Lack of Transportation (Non-Medical): No  Physical Activity: Not on file  Stress: Not on file  Social Connections: Not on file  Intimate Partner Violence: Not At Risk (03/07/2024)   Humiliation, Afraid, Rape, and Kick questionnaire    Fear of Current or Ex-Partner: No    Emotionally Abused: No    Physically Abused: No    Sexually Abused: No    ROS  Per HPI      Objective    BP 138/74   Pulse 83   Temp 98.1 F (36.7 C) (Oral)   Resp 16   Ht 5' 3.19 (1.605 m)   Wt 139 lb 11.2 oz (63.4 kg)   LMP 02/24/2024 (Approximate)   SpO2 98%   Breastfeeding No   BMI 24.60 kg/m   Physical Exam Constitutional:      General: She is not in acute distress.    Appearance: Normal appearance. She is not ill-appearing.  HENT:     Head: Normocephalic and atraumatic.     Nose: Nose normal.  Eyes:     General: No scleral icterus.    Conjunctiva/sclera: Conjunctivae normal.  Cardiovascular:     Rate and Rhythm: Normal rate and regular rhythm.     Heart sounds: Normal heart sounds. No murmur heard.    No friction rub.  Pulmonary:      Effort: Pulmonary effort is normal. No respiratory distress.     Breath sounds: Normal breath sounds. No wheezing, rhonchi or rales.  Musculoskeletal:        General: Normal range of motion.     Comments: TTP over R sided paraspinous musculature.  Skin:  General: Skin is warm and dry.     Coloration: Skin is not jaundiced or pale.     Comments: No vesicular lesions on left under nare- does appear to be slightly erythematous  Neurological:     General: No focal deficit present.     Mental Status: She is alert.  Psychiatric:        Mood and Affect: Mood normal.        Behavior: Behavior normal.         Assessment & Plan:   Assessment and Plan    Encounter to Establish Care  Herpes simplex virus infection Recurrent herpes simplex virus infection with current lesion on the nose, confirmed by previous swab. Lesion is red and slightly swollen, indicating an impending outbreak. Stress exacerbates outbreaks. - Prescribed Valtrex  500 mg once daily for 1-2 months to suppress outbreaks. - Advised against concurrent use of acyclovir while on Valtrex . - Discussed potential side effects of Valtrex , including nausea, and provided Zofran  for nausea management.  Generalized anxiety disorder Chronic, not at goal. Symptoms of difficulty breathing exacerbated by stress. Symptoms have persisted for over six months. Possible minor panic attacks with symptoms of shakiness, heart racing, and tingling in fingertips. Prozac  is considered due to family history of positive response in her son. - Prescribed Prozac  10 mg daily, with one refill, to be taken after 3-6 weeks. - Discussed potential side effects of Prozac , including nausea, and provided Zofran  for nausea management. - Scheduled follow-up in 6 weeks to assess Prozac  efficacy.  Nicotine dependence Current smoking of half a pack per day, with increased use during stress. Expresses desire to quit smoking and is ready to attempt cessation.  Discussed options for smoking cessation, including Wellbutrin and Chantix . Chantix  chosen due to her readiness to quit and willingness to try it despite potential side effects like bad dreams. - Prescribed Chantix  starter pack with instructions for gradual increase in dosage. - Advised to take Chantix  after eating with a full glass of water. - Plan for continued use of Chantix  for at least 11 weeks.  Scoliosis Slight curvature of the spine. Reports chronic lower back pain, possibly related to scoliosis or muscular issues from coughing due to smoking. - Plan for future x-ray of the back to assess scoliosis progression.  General health maintenance Due for Pap smear as last one was over five years ago. Family history of breast and lung cancer. No recent labs performed. - Recommended Pap smear at [facility] or another facility. - Plan for routine labs at a future visit.       Return in about 6 weeks (around 04/18/2024) for Prozac  start.   Voula Waln T Birtie Fellman, PA-C

## 2024-03-07 NOTE — Patient Instructions (Addendum)
 It was nice to see you today!  Prozac is an SSRI. The SSRI class can have side effects such as weight gain, sexual dysfunction, insomnia, headache, nausea. These medications are generally effective at alleviating symptoms of anxiety and/or depression. Let me know if significant side effects do occur.  Chantix (Varenicline) is a medication that is often used as a tool to help patients quit tobacco use. The medication works by preventing nicotine stimulation of mesolimbic dopamine system associated with nicotine use disorder- in other words it can prevent the nicotine from feeling rewarding. The most common side effects include nausea, vomiting, abnormal dreams, depressed mood, and headaches. Patients with a history of seizures should not use varenicline because there could be a dose dependent reaction within the CNS system. Please let me know if there is any reason you don't tolerate this medication.  Dosing: Take after eating with a full glass of water. Start v?r?ni?li??, then quit on day 8 or start v?r??i?line, then quit tobacco use between days 8 to 35. Days 1 to 3: 0.5 mg once daily. Days 4 to 7: 0.5 mg twice daily. Days 8+ (maintenance dose): 1 mg twice daily   Duration: Continue maintenance dose for at least 11 weeks (for a total of at least 12 weeks of treatment).  If you have any problems before your next visit feel free to message me via MyChart (minor issues or questions) or call the office, otherwise you may reach out to schedule an office visit.  Thank you! Rose Hippler, PA-C

## 2024-03-08 ENCOUNTER — Encounter (HOSPITAL_BASED_OUTPATIENT_CLINIC_OR_DEPARTMENT_OTHER): Payer: Self-pay | Admitting: Student

## 2024-03-08 ENCOUNTER — Ambulatory Visit (INDEPENDENT_AMBULATORY_CARE_PROVIDER_SITE_OTHER)
Admission: RE | Admit: 2024-03-08 | Discharge: 2024-03-08 | Disposition: A | Source: Ambulatory Visit | Attending: Student | Admitting: Radiology

## 2024-03-08 ENCOUNTER — Telehealth (HOSPITAL_BASED_OUTPATIENT_CLINIC_OR_DEPARTMENT_OTHER): Payer: Self-pay

## 2024-03-08 DIAGNOSIS — M419 Scoliosis, unspecified: Secondary | ICD-10-CM | POA: Diagnosis not present

## 2024-03-08 NOTE — Telephone Encounter (Signed)
 Pt said she saw Miranda Boyd yesterday and is requesting an x-ray. She said she does not feel like it is anxiety. She stated her back is really hurting. Believes she has scoliosis.

## 2024-03-13 ENCOUNTER — Telehealth (HOSPITAL_BASED_OUTPATIENT_CLINIC_OR_DEPARTMENT_OTHER): Payer: Self-pay | Admitting: Student

## 2024-03-13 ENCOUNTER — Ambulatory Visit: Payer: Self-pay

## 2024-03-13 NOTE — Telephone Encounter (Signed)
 Copied from CRM #[id number]. Topic: Clinical - Lab/Test Results >> Mar 13, 2024  8:12 AM Miranda Boyd wrote: Reason for CRM: Patient is calling to get results for back xray that was done. Patient also stated that she had labs done too.    [phone] (H)

## 2024-03-13 NOTE — Telephone Encounter (Signed)
 FYI Only or Action Required?: Action required by provider: clinical question for provider, update on patient condition, lab or test result follow-up needed, and requesting something else for pain.  Patient was last seen in primary care on 03/07/2024 by Rothfuss, Lang DASEN, PA-C.  Called Nurse Triage reporting Back Pain.  Symptoms began several weeks ago.  Interventions attempted: OTC medications: ibuprofen and Rest, hydration, or home remedies.  Symptoms are: unchanged.  Triage Disposition: See HCP Within 4 Hours (Or PCP Triage)  Patient/caregiver understands and will follow disposition?: No, wishes to speak with PCP  Copied from CRM #[id number]. Topic: Clinical - Red Word Triage >> Mar 13, 2024  2:18 PM Joesph NOVAK wrote: Red Word that prompted transfer to Nurse Triage: pt is in excruciating pain, needs xray results. - Reason for Disposition  [1] SEVERE back pain (e.g., excruciating, unable to do any normal activities) AND [2] not improved 2 hours after pain medicine  Answer Assessment - Initial Assessment Questions Patient calling with severe back pain that is located in the middle of her back. Patient is awaiting XR results from 03/08/2024. Patient is requesting something strong for pain medication. Patient refusing to be seen at Urgent Care or ED at this time.   1. ONSET: When did the pain begin? (e.g., minutes, hours, days)     Back pain has been going on for a month 2. LOCATION: Where does it hurt? (upper, mid or lower back)     Mid back that wraps around the ribs 3. SEVERITY: How bad is the pain?  (e.g., Scale 1-10; mild, moderate, or severe)     8 out of 10 4. PATTERN: Is the pain constant? (e.g., yes, no; constant, intermittent)      constant 5. RADIATION: Does the pain shoot into your legs or somewhere else?     No radiation 6. CAUSE:  What do you think is causing the back pain?      unsure 7. BACK OVERUSE:  Any recent lifting of heavy objects, strenuous work or  exercise?     no 8. MEDICINES: What have you taken so far for the pain? (e.g., nothing, acetaminophen, NSAIDS)     ibuprofen 9. NEUROLOGIC SYMPTOMS: Do you have any weakness, numbness, or problems with bowel/bladder control?     no 10. OTHER SYMPTOMS: Do you have any other symptoms? (e.g., fever, abdomen pain, burning with urination, blood in urine)       Urinary frequency 11. PREGNANCY: Is there any chance you are pregnant? When was your last menstrual period?       no  Protocols used: Back Pain-A-AH

## 2024-03-14 NOTE — Telephone Encounter (Signed)
 Pt advised we are waiting on x-ray results. She is aware no labs had been drawn the day of her visit. She would like to wait for her results. Advised to go to ER if she is having severe midline back pain.

## 2024-03-15 ENCOUNTER — Ambulatory Visit (HOSPITAL_BASED_OUTPATIENT_CLINIC_OR_DEPARTMENT_OTHER): Payer: Self-pay | Admitting: Student

## 2024-03-15 ENCOUNTER — Telehealth (HOSPITAL_BASED_OUTPATIENT_CLINIC_OR_DEPARTMENT_OTHER): Payer: Self-pay

## 2024-03-15 ENCOUNTER — Encounter (HOSPITAL_BASED_OUTPATIENT_CLINIC_OR_DEPARTMENT_OTHER): Payer: Self-pay

## 2024-03-15 NOTE — Telephone Encounter (Signed)
 Per [facility] on [address] pt has not gotten a flu shot this year.

## 2024-03-16 ENCOUNTER — Encounter (HOSPITAL_BASED_OUTPATIENT_CLINIC_OR_DEPARTMENT_OTHER): Payer: Self-pay | Admitting: Student

## 2024-03-16 ENCOUNTER — Other Ambulatory Visit (HOSPITAL_BASED_OUTPATIENT_CLINIC_OR_DEPARTMENT_OTHER): Payer: Self-pay

## 2024-03-16 ENCOUNTER — Ambulatory Visit (INDEPENDENT_AMBULATORY_CARE_PROVIDER_SITE_OTHER): Admitting: Student

## 2024-03-16 VITALS — BP 122/83 | HR 95 | Temp 98.5°F | Resp 16 | Ht 63.19 in | Wt 140.0 lb

## 2024-03-16 DIAGNOSIS — J4520 Mild intermittent asthma, uncomplicated: Secondary | ICD-10-CM | POA: Diagnosis not present

## 2024-03-16 DIAGNOSIS — M546 Pain in thoracic spine: Secondary | ICD-10-CM

## 2024-03-16 DIAGNOSIS — J0100 Acute maxillary sinusitis, unspecified: Secondary | ICD-10-CM

## 2024-03-16 DIAGNOSIS — R002 Palpitations: Secondary | ICD-10-CM | POA: Insufficient documentation

## 2024-03-16 MED ORDER — METHYLPREDNISOLONE 4 MG PO TBPK
ORAL_TABLET | ORAL | 0 refills | Status: DC
  Filled 2024-03-16: qty 21, 6d supply, fill #0

## 2024-03-16 MED ORDER — DOXYCYCLINE HYCLATE 100 MG PO TABS
100.0000 mg | ORAL_TABLET | Freq: Two times a day (BID) | ORAL | 0 refills | Status: AC
  Filled 2024-03-16: qty 14, 7d supply, fill #0

## 2024-03-16 NOTE — Patient Instructions (Signed)
 It was nice to see you today!  Please make sure to take in plenty of fluids. I have ordered a long term monitor for your heart to assess it over a longer period of time. Make sure to use your inhaler at home for chest tightness- your lungs sound fine today.  If you have any problems before your next visit feel free to message me via MyChart (minor issues or questions) or call the office, otherwise you may reach out to schedule an office visit.  Thank you! Jakeisha Stricker, PA-C

## 2024-03-16 NOTE — Progress Notes (Signed)
 Acute Office Visit  Subjective:     Patient ID: Miranda Boyd, female    DOB: 1985-09-23, 38 y.o.   MRN: [id number]  Chief Complaint  Patient presents with   URI    Pt states her sxs are getting worse. Said she blacked out last night. Head felt fuzzy. Coughing up phlegm balls. Back is still hurting, she believes it is due to the front. Wants to know if heart and lungs show up on x-ray.   Palpitations    Stopped prozac . Does not help her palpitations.    HPI  Discussed the use of AI scribe software for clinical note transcription with the patient, who gave verbal consent to proceed.  History of Present Illness   Miranda Boyd is a 38 year old female who presents with worsening symptoms of dizziness, heart fluttering, and respiratory issues.  She experiences dizziness and a sensation of 'blacking out' without losing consciousness, describing seeing 'specs' and feeling dizzy, which she associates with presyncope. This episode occurred while standing. She notes frequent urination but difficulty with bowel movements. She drinks primarily water, estimating about eight small bottles a day, and has coffee in the morning.  She has been experiencing heart fluttering, which worsens with coughing and feels like a tight sensation in her chest. She reports shortness of breath, especially at night, which improves when she props up her pillows. She has a history of asthma and has noticed a slight wheeze at night but has not been using her inhaler regularly.  She has been coughing up phlegm for about a week, which she describes as worsening. She denies facial pain or pressure but notes some pressure on the right side.  She has a history of scoliosis and experiences back pain, which she manages with stretches and a heat pad. The pain is described as terrible, wrapping around to her ribs, and is exacerbated by coughing. Her mother observed that one side of her back appears more puffy than the other. She  experiences tingling under her arm and numbness in her leg when lying down, which she attributes to possible nerve issues.  She is currently taking Prozac  but stopped it briefly due to feeling 'fluttering' and resumed it after a day. She has a history of adverse reactions to penicillins and cephalexin, and she is cautious about using NSAIDs due to stomach issues.      ROS Per HPI     Objective:    BP 122/83   Pulse 95   Temp 98.5 F (36.9 C) (Oral)   Resp 16   Ht 5' 3.19 (1.605 m)   Wt 140 lb (63.5 kg)   LMP 02/24/2024 (Approximate)   SpO2 99%   BMI 24.65 kg/m  BP Readings from Last 3 Encounters:  03/16/24 122/83  03/07/24 138/74  02/28/24 127/74   Wt Readings from Last 3 Encounters:  03/16/24 140 lb (63.5 kg)  03/07/24 139 lb 11.2 oz (63.4 kg)  07/31/20 144 lb 4.8 oz (65.5 kg)   SpO2 Readings from Last 3 Encounters:  03/16/24 99%  03/07/24 98%  02/28/24 98%      Physical Exam Constitutional:      General: She is not in acute distress.    Appearance: Normal appearance. She is not ill-appearing.  HENT:     Head: Normocephalic and atraumatic.     Right Ear: Ear canal and external ear normal.     Left Ear: Tympanic membrane, ear canal and external ear normal.  Ears:     Comments: R TM with straw colored effusion    Nose: Nose normal.     Comments: R maxillary sinus TTP Eyes:     General: No scleral icterus.    Conjunctiva/sclera: Conjunctivae normal.  Cardiovascular:     Rate and Rhythm: Normal rate and regular rhythm.     Heart sounds: Normal heart sounds. No murmur heard.    No friction rub.  Pulmonary:     Effort: Pulmonary effort is normal. No respiratory distress.     Breath sounds: Normal breath sounds. No wheezing, rhonchi or rales.  Musculoskeletal:        General: Normal range of motion.  Skin:    General: Skin is warm and dry.     Coloration: Skin is not jaundiced or pale.  Neurological:     General: No focal deficit present.     Mental  Status: She is alert.  Psychiatric:        Mood and Affect: Mood normal.        Behavior: Behavior normal.     No results found for any visits on 03/16/24.      Assessment & Plan:   Assessment and Plan    Acute maxillary sinusitis Minor sinus infection with fluid in the right ear, likely causing pressure buildup. Symptoms include phlegm production and facial pressure. No true infection observed, but fluid presence indicates sinusitis. - Prescribed doxycycline 100 mg twice daily for 7 days - Advised taking doxycycline with food to prevent nausea  Acute thoracic back pain Likely muscular in origin, possibly exacerbated by scoliosis and recent sickness- also noted to have been pulling on furniture recently. Pain is bilateral, wrapping around to the ribs, and worsens with certain movements. Coughing may be contributing to the pain. Discussed natural course of muscular back pain. - Prescribed Medrol  Dosepak for inflammation - Advised against NSAIDs while on Medrol  Dosepak - Encouraged use of heat pad for muscle relaxation - Recommended PT for scoliosis but not pursuing at this time  Palpitations and presyncope Palpitations described as fluttering sensation, worsened by coughing. Hx of the same suspected due to panic syx. Presyncope episode likely due to dehydration in setting of sickness. No concerning findings on chest X-ray. EKG planned to assess heart's electrical activity. Discussed potential for long-term heart monitoring if EKG is abnormal. - Continue Prozac  for six weeks - Ordered EKG to assess heart's electrical activity- normal - Advised increasing fluid intake and salt intake to prevent dehydration - Order Zio monitor  Asthma Mild wheezing possibly contributing to shortness of breath, especially at night. No wheeze heard during examination, but history of asthma noted. She states she has an inhaler at home. - Advised using inhaler at night if wheezing occurs - Monitor for  improvement in breathing with inhaler use      Return in about 3 months (around 06/14/2024) for Chronic Followup.  Marv Alfrey T Calil Amor, PA-C

## 2024-03-17 LAB — COMPREHENSIVE METABOLIC PANEL WITH GFR
ALT: 11 IU/L (ref 0–32)
AST: 17 IU/L (ref 0–40)
Albumin: 5 g/dL — ABNORMAL HIGH (ref 3.9–4.9)
Alkaline Phosphatase: 50 IU/L (ref 41–116)
BUN/Creatinine Ratio: 15 (ref 9–23)
BUN: 11 mg/dL (ref 6–20)
Bilirubin Total: 0.3 mg/dL (ref 0.0–1.2)
CO2: 22 mmol/L (ref 20–29)
Calcium: 9.4 mg/dL (ref 8.7–10.2)
Chloride: 101 mmol/L (ref 96–106)
Creatinine, Ser: 0.72 mg/dL (ref 0.57–1.00)
Globulin, Total: 2.4 g/dL (ref 1.5–4.5)
Glucose: 88 mg/dL (ref 70–99)
Potassium: 4.5 mmol/L (ref 3.5–5.2)
Sodium: 139 mmol/L (ref 134–144)
Total Protein: 7.4 g/dL (ref 6.0–8.5)
eGFR: 110 mL/min/1.73 (ref >59)

## 2024-03-17 LAB — MAGNESIUM: Magnesium: 2.3 mg/dL (ref 1.6–2.3)

## 2024-03-17 LAB — TSH: TSH: 1.73 u[IU]/mL (ref 0.450–4.500)

## 2024-03-21 ENCOUNTER — Ambulatory Visit (HOSPITAL_BASED_OUTPATIENT_CLINIC_OR_DEPARTMENT_OTHER): Payer: Self-pay | Admitting: Student

## 2024-03-23 ENCOUNTER — Ambulatory Visit: Payer: Self-pay

## 2024-03-23 ENCOUNTER — Telehealth: Payer: Self-pay

## 2024-03-23 NOTE — Telephone Encounter (Unsigned)
 Copied from CRM #[id number]. Topic: Clinical - Medical Advice >> Mar 23, 2024 11:41 AM Ahlexyia S wrote: Reason for CRM: Pt was triaged this morning and the EMS was called out to pt home due to dry mouth, heart palpitations, chills and trouble breathing. Pt was advised by EMS to get a CT scan or a MRI done and extensive blood work to determine what is going on and to look further into the issue. Mom is wanting to know what they should do moving forward. Pt mom Johnston is requesting a call back regarding this.

## 2024-03-23 NOTE — Telephone Encounter (Addendum)
 FYI Only or Action Required?: FYI only for provider: 911 called.  Patient was last seen in primary care on 03/16/2024 by Rothfuss, Lang DASEN, PA-C.  Called Nurse Triage reporting Chest Pain.  Symptoms began yesterday.  Interventions attempted: Other: Triage cut short d/t severity of symptoms.  Symptoms are: rapidly improving.  Triage Disposition: Call EMS 911 Now  Patient/caregiver understands and will follow disposition?: Yes   Yesterday onset of constant 10/10 CP like someone is sitting on her chest, pounding heart, SOB, nausea, dizziness, dry mouth, back pain. Feeling cool and clammy. Yesterday had a panic attack. Pt awake and alert at this time but sounds very distressed. Triage cut short d/t severity of pt symptoms. Agreeable to call 911. This RN called 911 and stayed on the line with pt to confirm dispatch. Pts mom there with her now. Unlocking the doors.    Copied from CRM #[id number]. Topic: Clinical - Red Word Triage >> Mar 23, 2024 10:08 AM Ashley R wrote: Red Word that prompted transfer to Nurse Triage: dry mouth, heart palpitations, chills, problems breathing. onset last night, worsening this morning Reason for Disposition  [1] Chest pain lasts > 5 minutes AND [2] described as crushing, pressure-like, or heavy  Answer Assessment - Initial Assessment Questions 1. LOCATION: Where does it hurt?       Chest 2. RADIATION: Does the pain go anywhere else? (e.g., into neck, jaw, arms, back)     Arm 3. ONSET: When did the chest pain begin? (Minutes, hours or days)      Yesterday 4. PATTERN: Does the pain come and go, or has it been constant since it started?  Does it get worse with exertion?      Constant 5. DURATION: How long does it last (e.g., seconds, minutes, hours)     Constant 6. SEVERITY: How bad is the pain?  (e.g., Scale 1-10; mild, moderate, or severe)     10/10 crushing 7. CARDIAC RISK FACTORS: Do you have any history of heart problems or risk factors  for heart disease? (e.g., angina, prior heart attack; diabetes, high blood pressure, high cholesterol, smoker, or strong family history of heart disease)     *No Answer* 8. PULMONARY RISK FACTORS: Do you have any history of lung disease?  (e.g., blood clots in lung, asthma, emphysema, birth control pills)     *No Answer* 9. CAUSE: What do you think is causing the chest pain?     *No Answer* 10. OTHER SYMPTOMS: Do you have any other symptoms? (e.g., dizziness, nausea, vomiting, sweating, fever, difficulty breathing, cough)       SOB, nausea, dizziness, dry mouth. Heart pounding. Back pain. Feeling cool and clammy. 11. PREGNANCY: Is there any chance you are pregnant? When was your last menstrual period?       *No Answer*  Protocols used: Chest Pain-A-AH

## 2024-04-06 ENCOUNTER — Telehealth (HOSPITAL_BASED_OUTPATIENT_CLINIC_OR_DEPARTMENT_OTHER): Payer: Self-pay

## 2024-04-06 NOTE — Telephone Encounter (Signed)
 Mychart appt made for 04/09/2024 at 1:10pm

## 2024-04-09 ENCOUNTER — Ambulatory Visit: Attending: Student

## 2024-04-09 ENCOUNTER — Other Ambulatory Visit (HOSPITAL_BASED_OUTPATIENT_CLINIC_OR_DEPARTMENT_OTHER): Payer: Self-pay

## 2024-04-09 ENCOUNTER — Telehealth (INDEPENDENT_AMBULATORY_CARE_PROVIDER_SITE_OTHER): Admitting: Student

## 2024-04-09 DIAGNOSIS — B009 Herpesviral infection, unspecified: Secondary | ICD-10-CM

## 2024-04-09 DIAGNOSIS — F410 Panic disorder [episodic paroxysmal anxiety] without agoraphobia: Secondary | ICD-10-CM

## 2024-04-09 DIAGNOSIS — R002 Palpitations: Secondary | ICD-10-CM

## 2024-04-09 MED ORDER — PROPRANOLOL HCL 20 MG PO TABS
20.0000 mg | ORAL_TABLET | Freq: Two times a day (BID) | ORAL | 2 refills | Status: DC | PRN
  Filled 2024-04-09: qty 60, 30d supply, fill #0

## 2024-04-09 MED ORDER — ACYCLOVIR 400 MG PO TABS
400.0000 mg | ORAL_TABLET | Freq: Two times a day (BID) | ORAL | 3 refills | Status: AC
  Filled 2024-04-09: qty 60, 30d supply, fill #0

## 2024-04-09 NOTE — Progress Notes (Signed)
 "  Virtual Visit via Video Note  I connected with Miranda Boyd on 04/09/2024 at  1:10 PM EST by a video enabled telemedicine application and verified that I am speaking with the correct person using two identifiers.  Patient Location: Home Provider Location: Office/Clinic  I discussed the limitations, risks, security, and privacy concerns of performing an evaluation and management service by video and the availability of in person appointments. I also discussed with the patient that there may be a patient responsible charge related to this service. The patient expressed understanding and agreed to proceed.  Subjective: PCP: Collen Hostler, Lang DASEN, PA-C  Chief Complaint  Patient presents with   Anxiety    Discuss panic attack. Does not want to keep taking prozac .   Panic Attack- Has been taking Prozac  for about 6 weeks and feels that it is not helping. Does not like how prozac  makes her feel. Mom is on the video visit with her and inquires about other options. She was in pain yesterday with her back and feels that it is causing some panic symptoms for her. She inquires about trying propranolol  for panic attacks. Declined trying another SSRI or SNRI for now.   HSV- On suppressive therapy with valtrex , making her stomach hurt. Inquires about using acyclovir  instead.   ROS: Per HPI Current Medications[1]  Observations/Objective: There were no vitals filed for this visit. Physical Exam Constitutional:      General: She is not in acute distress.    Appearance: Normal appearance. She is not ill-appearing.  HENT:     Head: Normocephalic.     Nose: Nose normal.  Eyes:     Conjunctiva/sclera: Conjunctivae normal.  Skin:    Coloration: Skin is not jaundiced or pale.  Neurological:     Mental Status: She is alert.  Psychiatric:        Mood and Affect: Mood is anxious. Mood is not depressed. Affect is not blunt, flat, angry or tearful.     Assessment and Plan: Panic disorder Chronic, not at  goal. - Somatic symptoms present.  - Trial of propranolol  20 mg bid prn - Counseled on MOA and lowering of HR likely  HSV - immunosuppressive therapy with valtrex  is causing abdominal discomfort - Trial of acyclovir  400 mg bid  Follow Up Instructions: Return if symptoms worsen or fail to improve.   I discussed the assessment and treatment plan with the patient. The patient was provided an opportunity to ask questions, and all were answered. The patient agreed with the plan and demonstrated an understanding of the instructions.   The patient was advised to call back or seek an in-person evaluation if the symptoms worsen or if the condition fails to improve as anticipated.  The above assessment and management plan was discussed with the patient. The patient verbalized understanding of and has agreed to the management plan.   Shantaya Bluestone T Karynn Deblasi, PA-C    [1]  Current Outpatient Medications:    acyclovir  (ZOVIRAX ) 400 MG tablet, Take 1 tablet (400 mg total) by mouth 2 (two) times daily., Disp: 60 tablet, Rfl: 3   B Complex-Folic Acid (B COMPLEX PLUS PO), Take by mouth. With Vitamin C daily, Disp: , Rfl:    FLUoxetine  (PROZAC ) 10 MG tablet, Take 10 mg by mouth daily., Disp: , Rfl:    propranolol  (INDERAL ) 20 MG tablet, Take 1 tablet (20 mg total) by mouth 2 (two) times daily as needed., Disp: 60 tablet, Rfl: 2   Varenicline  Tartrate, Starter, (CHANTIX  STARTING  MONTH PAK) 0.5 MG X 11 & 1 MG X 42 TBPK, Take by mouth as directed. (Patient not taking: Reported on 04/09/2024), Disp: 53 each, Rfl: 0  "

## 2024-04-11 ENCOUNTER — Telehealth (HOSPITAL_BASED_OUTPATIENT_CLINIC_OR_DEPARTMENT_OTHER): Payer: Self-pay

## 2024-04-11 ENCOUNTER — Telehealth (HOSPITAL_BASED_OUTPATIENT_CLINIC_OR_DEPARTMENT_OTHER): Payer: Self-pay | Admitting: Student

## 2024-04-11 NOTE — Telephone Encounter (Signed)
 Pt said she took it and could not even sleep and then tired taking a nap and could not take a nap either. 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 Asking if she can get a low dose of xanax? She is not sleeping. 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 She is not taking anything right now.

## 2024-04-11 NOTE — Telephone Encounter (Signed)
"   very important  You completed this message on 04/11/2024. The information that appears might not be up to date.    important suggestion  You completed this message for [facility].   CRM # [id number] Owner: None Status: Resolved Open  Priority: Routine Created on: 04/10/2024 12:31 PM By: Beverley Laymon SAILOR   Primary Information  Source  Miranda Boyd No (Patient)   Subject  Miranda Boyd, Miranda Boyd (Patient)   Topic  Clinical - Medication Question    Communication  Reason for CRM: Patient started taking propranolol  20 mg yesterday and did not sleep at all. She said she tried to take a nap but didn't feel like she could. Having weird vivid dreams. Wanting a nurse to contact her as soon as possible.   "

## 2024-04-12 ENCOUNTER — Telehealth (HOSPITAL_BASED_OUTPATIENT_CLINIC_OR_DEPARTMENT_OTHER): Payer: Self-pay

## 2024-04-12 NOTE — Telephone Encounter (Signed)
 Patient called Miranda Boyd wants to see if [facility] provider can send in a different medication due to the med prescribed is keeping her awake and she is having trouble sleeping

## 2024-04-13 ENCOUNTER — Other Ambulatory Visit (HOSPITAL_BASED_OUTPATIENT_CLINIC_OR_DEPARTMENT_OTHER): Payer: Self-pay | Admitting: Student

## 2024-04-13 ENCOUNTER — Other Ambulatory Visit (HOSPITAL_BASED_OUTPATIENT_CLINIC_OR_DEPARTMENT_OTHER): Payer: Self-pay

## 2024-04-13 ENCOUNTER — Telehealth (HOSPITAL_BASED_OUTPATIENT_CLINIC_OR_DEPARTMENT_OTHER): Payer: Self-pay | Admitting: Student

## 2024-04-13 DIAGNOSIS — F410 Panic disorder [episodic paroxysmal anxiety] without agoraphobia: Secondary | ICD-10-CM

## 2024-04-13 MED ORDER — CLONAZEPAM 0.5 MG PO TABS: 0.5000 mg | ORAL_TABLET | Freq: Two times a day (BID) | ORAL | 0 refills | Status: DC | PRN

## 2024-04-13 NOTE — Telephone Encounter (Signed)
 Pt advised. She agreed to starting clonazepam . She understands if it is a long term med, she would need to see a psychiatrist.

## 2024-04-13 NOTE — Telephone Encounter (Signed)
 Copied from CRM [id number]. Topic: Clinical - Prescription Issue >> Apr 13, 2024  3:13 PM Wess RAMAN wrote: Reason for CRM: Patient still hasn't received Klonopin  and would like it sent to [facility].  Callback #: [id number]  Pharmacy: [facility] [address], [place] - [address] [place] [address] Phone: [phone] Fax: [phone] Hours: Not open 24 hours

## 2024-04-20 ENCOUNTER — Other Ambulatory Visit (HOSPITAL_BASED_OUTPATIENT_CLINIC_OR_DEPARTMENT_OTHER): Payer: Self-pay | Admitting: Student

## 2024-04-20 ENCOUNTER — Telehealth (HOSPITAL_BASED_OUTPATIENT_CLINIC_OR_DEPARTMENT_OTHER): Payer: Self-pay

## 2024-04-20 DIAGNOSIS — F410 Panic disorder [episodic paroxysmal anxiety] without agoraphobia: Secondary | ICD-10-CM

## 2024-04-20 MED ORDER — ALPRAZOLAM 0.25 MG PO TABS: 0.2500 mg | ORAL_TABLET | Freq: Two times a day (BID) | ORAL | 0 refills | Status: DC | PRN

## 2024-04-20 NOTE — Telephone Encounter (Signed)
 Copied from CRM [id number]. Topic: Clinical - Prescription Issue >> Apr 20, 2024  8:11 AM [name] wrote: Reason for CRM: Patient called in regarding her prescription clonazePAM  (KLONOPIN ) 0.5 MG tablet , stated she is going to stop the medication making her angry and mood swings, patient stated she is still waiting on the referral as well, woulf like a callback   [phone]

## 2024-04-26 ENCOUNTER — Ambulatory Visit (HOSPITAL_BASED_OUTPATIENT_CLINIC_OR_DEPARTMENT_OTHER): Admitting: Student

## 2024-04-26 ENCOUNTER — Encounter (HOSPITAL_BASED_OUTPATIENT_CLINIC_OR_DEPARTMENT_OTHER): Payer: Self-pay | Admitting: Student

## 2024-04-26 ENCOUNTER — Other Ambulatory Visit (HOSPITAL_BASED_OUTPATIENT_CLINIC_OR_DEPARTMENT_OTHER): Payer: Self-pay

## 2024-04-26 VITALS — BP 123/73 | HR 96 | Temp 98.9°F | Resp 16 | Ht 63.19 in | Wt 143.1 lb

## 2024-04-26 DIAGNOSIS — J0100 Acute maxillary sinusitis, unspecified: Secondary | ICD-10-CM

## 2024-04-26 MED ORDER — DOXYCYCLINE HYCLATE 100 MG PO TABS
100.0000 mg | ORAL_TABLET | Freq: Two times a day (BID) | ORAL | 0 refills | Status: AC
  Filled 2024-04-26: qty 14, 7d supply, fill #0

## 2024-04-26 MED ORDER — DOXYCYCLINE HYCLATE 100 MG PO TABS: 100.0000 mg | ORAL_TABLET | Freq: Two times a day (BID) | ORAL | 0 refills | Status: DC

## 2024-04-26 NOTE — Progress Notes (Signed)
 "  Acute Office Visit  Subjective:     Patient ID: Miranda Boyd, female    DOB: 02/09/1986, 39 y.o.   MRN: [id number]  Chief Complaint  Patient presents with   Sore Throat    Pt has a sore throat that is getting worse. About 1 week now. Has a lot of facial pressure and ear pressure.  Congested. Coughing and feels it in her chest. No fevers. A little bit of upset stomach but has been constipated.   Discussed the use of AI scribe software for clinical note transcription with the patient, who gave verbal consent to proceed.  History of Present Illness   Miranda Boyd is a 39 year old female who presents with worsening throat pain and facial pressure for one week.  She has been experiencing significant throat pain and bilateral facial pressure for the past week, with the worst episode occurring this morning. The facial pressure extends to her ears, with some pressure noted in the ears as well.  No fever, chills, or sweats, although she mentions experiencing some night sweats.  She has a history of adverse reactions to penicillins, including diarrhea, encephalitis, and hives, and therefore avoids medications from this class. She has previously used doxycycline  without issues and is currently taking Xanax , which she breaks in half and takes around 5-6 PM when symptoms start to intensify. This medication helps alleviate her symptoms.      ROS Per HPI     Objective:    BP 123/73   Pulse 96   Temp 98.9 F (37.2 C) (Oral)   Resp 16   Ht 5' 3.19 (1.605 m)   Wt 143 lb 1.6 oz (64.9 kg)   LMP 04/05/2024 (Approximate)   SpO2 98%   BMI 25.20 kg/m  BP Readings from Last 3 Encounters:  04/26/24 123/73  03/16/24 122/83  03/07/24 138/74   Wt Readings from Last 3 Encounters:  04/26/24 143 lb 1.6 oz (64.9 kg)  03/16/24 140 lb (63.5 kg)  03/07/24 139 lb 11.2 oz (63.4 kg)   SpO2 Readings from Last 3 Encounters:  04/26/24 98%  03/16/24 99%  03/07/24 98%   Physical Exam Constitutional:       General: She is not in acute distress.    Appearance: Normal appearance. She is not ill-appearing.  HENT:     Head: Normocephalic and atraumatic.     Right Ear: Ear canal normal. No drainage, swelling or tenderness. A middle ear effusion is present. Tympanic membrane is not erythematous.     Left Ear: Tympanic membrane and ear canal normal. No drainage, swelling or tenderness. Tympanic membrane is not erythematous.     Nose: Nose normal.     Comments: Tenderness to maxillary sinuses bilateral     Mouth/Throat:     Pharynx: Posterior oropharyngeal erythema present. No oropharyngeal exudate.     Tonsils: No tonsillar exudate.  Eyes:     General: No scleral icterus.    Conjunctiva/sclera: Conjunctivae normal.  Cardiovascular:     Rate and Rhythm: Normal rate and regular rhythm.     Heart sounds: Normal heart sounds. No murmur heard.    No friction rub.  Pulmonary:     Effort: Pulmonary effort is normal. No respiratory distress.     Breath sounds: Normal breath sounds. No wheezing, rhonchi or rales.  Musculoskeletal:        General: Normal range of motion.     Cervical back: Neck supple.  Lymphadenopathy:     Cervical: Cervical  adenopathy present.  Skin:    General: Skin is warm and dry.     Coloration: Skin is not jaundiced or pale.  Neurological:     General: No focal deficit present.     Mental Status: She is alert.  Psychiatric:        Mood and Affect: Mood normal.        Behavior: Behavior normal.     No results found for any visits on 04/26/24.      Assessment & Plan:   Assessment and Plan    Acute maxillary sinusitis Symptoms persisting for about a week, including facial pressure, throat discomfort, and nighttime sweats. No fever reported. Lungs are clear, and no need for chest x-ray. Differential diagnosis includes sinus infection, given the duration and worsening symptoms. Allergy to penicillin and cross-reactivity with cephalosporins noted, so doxycycline  is  chosen as an alternative antibiotic. - Prescribed doxycycline  twice daily for 7 days - Advised taking doxycycline  with food  Serous otitis media Fluid present in the right ear, likely secondary to sinusitis. No significant pain or pressure reported in the ears. Fluid expected to resolve as sinusitis improves. - Recommended Flonase if fluid persists      No orders of the defined types were placed in this encounter.   No follow-ups on file.  Kady Toothaker T Angad Nabers, PA-C  "

## 2024-04-26 NOTE — Patient Instructions (Signed)
 It was nice to see you today!  I have sent in doxycycline  to be taken twice daily with food for 7 days. Please drink plenty of fluids and let me know if you have any issues.  If you have any problems before your next visit feel free to message me via MyChart (minor issues or questions) or call the office, otherwise you may reach out to schedule an office visit.  Thank you! Norabelle Kondo, PA-C

## 2024-05-02 ENCOUNTER — Other Ambulatory Visit (HOSPITAL_BASED_OUTPATIENT_CLINIC_OR_DEPARTMENT_OTHER): Payer: Self-pay | Admitting: Family Medicine

## 2024-05-02 DIAGNOSIS — F410 Panic disorder [episodic paroxysmal anxiety] without agoraphobia: Secondary | ICD-10-CM

## 2024-05-02 MED ORDER — ALPRAZOLAM 0.25 MG PO TABS: 0.2500 mg | ORAL_TABLET | Freq: Two times a day (BID) | ORAL | 0 refills | Status: AC | PRN

## 2024-05-03 DIAGNOSIS — R002 Palpitations: Secondary | ICD-10-CM
